# Patient Record
Sex: Female | Born: 1935 | Race: White | Hispanic: No | Marital: Married | Smoking: Never smoker
Health system: Southern US, Community
[De-identification: ages and names within clinical notes are randomized; demographics above are authoritative.]

## PROBLEM LIST (undated history)

## (undated) DIAGNOSIS — I2510 Atherosclerotic heart disease of native coronary artery without angina pectoris: Secondary | ICD-10-CM

## (undated) DIAGNOSIS — I4891 Unspecified atrial fibrillation: Secondary | ICD-10-CM

## (undated) DIAGNOSIS — F419 Anxiety disorder, unspecified: Secondary | ICD-10-CM

## (undated) DIAGNOSIS — I495 Sick sinus syndrome: Secondary | ICD-10-CM

## (undated) DIAGNOSIS — G459 Transient cerebral ischemic attack, unspecified: Secondary | ICD-10-CM

## (undated) DIAGNOSIS — I10 Essential (primary) hypertension: Secondary | ICD-10-CM

## (undated) DIAGNOSIS — M1990 Unspecified osteoarthritis, unspecified site: Secondary | ICD-10-CM

## (undated) DIAGNOSIS — E039 Hypothyroidism, unspecified: Secondary | ICD-10-CM

## (undated) DIAGNOSIS — E785 Hyperlipidemia, unspecified: Secondary | ICD-10-CM

## (undated) HISTORY — PX: TONSILLECTOMY: SUR1361

## (undated) HISTORY — DX: Transient cerebral ischemic attack, unspecified: G45.9

## (undated) HISTORY — PX: CATARACT EXTRACTION W/ INTRAOCULAR LENS IMPLANT: SHX1309

## (undated) HISTORY — DX: Essential (primary) hypertension: I10

## (undated) HISTORY — DX: Hypothyroidism, unspecified: E03.9

## (undated) HISTORY — DX: Unspecified osteoarthritis, unspecified site: M19.90

## (undated) HISTORY — PX: TOTAL ABDOMINAL HYSTERECTOMY W/ BILATERAL SALPINGOOPHORECTOMY: SHX83

## (undated) HISTORY — DX: Atherosclerotic heart disease of native coronary artery without angina pectoris: I25.10

## (undated) HISTORY — DX: Unspecified atrial fibrillation: I48.91

## (undated) HISTORY — DX: Sick sinus syndrome: I49.5

## (undated) HISTORY — DX: Hyperlipidemia, unspecified: E78.5

## (undated) HISTORY — DX: Anxiety disorder, unspecified: F41.9

## (undated) HISTORY — PX: BREAST BIOPSY: SHX20

## (undated) HISTORY — PX: PACEMAKER INSERTION: SHX728

---

## 2004-12-07 ENCOUNTER — Emergency Department (HOSPITAL_COMMUNITY): Admission: EM | Admit: 2004-12-07 | Discharge: 2004-12-07 | Payer: Self-pay | Admitting: Emergency Medicine

## 2004-12-09 ENCOUNTER — Emergency Department (HOSPITAL_COMMUNITY): Admission: EM | Admit: 2004-12-09 | Discharge: 2004-12-09 | Payer: Self-pay | Admitting: Emergency Medicine

## 2006-12-20 ENCOUNTER — Ambulatory Visit: Payer: Self-pay | Admitting: Cardiology

## 2008-01-14 ENCOUNTER — Ambulatory Visit: Payer: Self-pay | Admitting: Cardiology

## 2008-01-16 ENCOUNTER — Encounter: Payer: Self-pay | Admitting: Cardiology

## 2008-01-16 ENCOUNTER — Ambulatory Visit: Payer: Self-pay | Admitting: Cardiovascular Disease

## 2008-01-16 ENCOUNTER — Ambulatory Visit: Payer: Self-pay | Admitting: Internal Medicine

## 2008-01-16 ENCOUNTER — Inpatient Hospital Stay (HOSPITAL_COMMUNITY): Admission: AD | Admit: 2008-01-16 | Discharge: 2008-01-23 | Payer: Self-pay | Admitting: Cardiology

## 2008-01-18 ENCOUNTER — Encounter: Payer: Self-pay | Admitting: Internal Medicine

## 2008-01-19 ENCOUNTER — Encounter: Payer: Self-pay | Admitting: Cardiology

## 2008-02-18 ENCOUNTER — Ambulatory Visit: Payer: Self-pay | Admitting: Cardiology

## 2008-02-18 DIAGNOSIS — I4891 Unspecified atrial fibrillation: Secondary | ICD-10-CM | POA: Insufficient documentation

## 2008-02-18 DIAGNOSIS — I2510 Atherosclerotic heart disease of native coronary artery without angina pectoris: Secondary | ICD-10-CM | POA: Insufficient documentation

## 2008-03-24 ENCOUNTER — Ambulatory Visit: Payer: Self-pay | Admitting: Cardiology

## 2008-04-23 ENCOUNTER — Ambulatory Visit: Payer: Self-pay | Admitting: Internal Medicine

## 2008-07-10 ENCOUNTER — Encounter: Payer: Self-pay | Admitting: Cardiology

## 2008-08-18 ENCOUNTER — Ambulatory Visit: Payer: Self-pay | Admitting: Cardiology

## 2008-09-30 ENCOUNTER — Encounter: Payer: Self-pay | Admitting: Cardiology

## 2008-10-17 ENCOUNTER — Encounter: Payer: Self-pay | Admitting: Cardiology

## 2008-11-21 ENCOUNTER — Encounter (INDEPENDENT_AMBULATORY_CARE_PROVIDER_SITE_OTHER): Payer: Self-pay | Admitting: *Deleted

## 2009-01-08 ENCOUNTER — Encounter: Payer: Self-pay | Admitting: Internal Medicine

## 2009-01-08 ENCOUNTER — Ambulatory Visit: Payer: Self-pay | Admitting: Internal Medicine

## 2009-01-08 DIAGNOSIS — I495 Sick sinus syndrome: Secondary | ICD-10-CM | POA: Insufficient documentation

## 2009-08-23 ENCOUNTER — Encounter: Payer: Self-pay | Admitting: Cardiology

## 2009-08-24 ENCOUNTER — Encounter: Payer: Self-pay | Admitting: Cardiology

## 2009-08-25 ENCOUNTER — Telehealth (INDEPENDENT_AMBULATORY_CARE_PROVIDER_SITE_OTHER): Payer: Self-pay | Admitting: *Deleted

## 2009-08-31 ENCOUNTER — Encounter (INDEPENDENT_AMBULATORY_CARE_PROVIDER_SITE_OTHER): Payer: Self-pay | Admitting: *Deleted

## 2009-08-31 ENCOUNTER — Ambulatory Visit: Payer: Self-pay | Admitting: Physician Assistant

## 2009-08-31 DIAGNOSIS — I10 Essential (primary) hypertension: Secondary | ICD-10-CM | POA: Insufficient documentation

## 2009-08-31 DIAGNOSIS — R079 Chest pain, unspecified: Secondary | ICD-10-CM

## 2009-08-31 DIAGNOSIS — E785 Hyperlipidemia, unspecified: Secondary | ICD-10-CM | POA: Insufficient documentation

## 2009-09-03 ENCOUNTER — Encounter: Payer: Self-pay | Admitting: Physician Assistant

## 2009-09-03 ENCOUNTER — Ambulatory Visit: Payer: Self-pay | Admitting: Cardiology

## 2009-09-10 ENCOUNTER — Ambulatory Visit: Payer: Self-pay | Admitting: Cardiology

## 2009-09-11 ENCOUNTER — Inpatient Hospital Stay (HOSPITAL_COMMUNITY): Admission: EM | Admit: 2009-09-11 | Discharge: 2009-09-18 | Payer: Self-pay | Admitting: Cardiology

## 2009-09-11 ENCOUNTER — Ambulatory Visit: Payer: Self-pay | Admitting: Cardiology

## 2009-09-14 ENCOUNTER — Encounter: Payer: Self-pay | Admitting: Cardiology

## 2009-09-18 ENCOUNTER — Encounter: Payer: Self-pay | Admitting: Cardiology

## 2009-09-19 ENCOUNTER — Ambulatory Visit: Payer: Self-pay | Admitting: Internal Medicine

## 2009-09-19 ENCOUNTER — Inpatient Hospital Stay (HOSPITAL_COMMUNITY): Admission: EM | Admit: 2009-09-19 | Discharge: 2009-10-01 | Payer: Self-pay | Admitting: Emergency Medicine

## 2009-09-19 ENCOUNTER — Encounter: Payer: Self-pay | Admitting: Cardiology

## 2009-09-20 ENCOUNTER — Encounter: Payer: Self-pay | Admitting: Internal Medicine

## 2009-09-21 ENCOUNTER — Encounter: Payer: Self-pay | Admitting: Cardiology

## 2009-09-21 ENCOUNTER — Ambulatory Visit: Payer: Self-pay | Admitting: Cardiothoracic Surgery

## 2009-09-22 ENCOUNTER — Encounter: Payer: Self-pay | Admitting: Cardiothoracic Surgery

## 2009-09-25 HISTORY — PX: CORONARY ARTERY BYPASS GRAFT: SHX141

## 2009-10-01 ENCOUNTER — Inpatient Hospital Stay: Admission: AD | Admit: 2009-10-01 | Discharge: 2009-11-12 | Payer: Self-pay | Admitting: Internal Medicine

## 2009-10-08 ENCOUNTER — Encounter: Payer: Self-pay | Admitting: Physician Assistant

## 2009-10-08 ENCOUNTER — Ambulatory Visit: Payer: Self-pay | Admitting: Cardiology

## 2009-10-12 ENCOUNTER — Telehealth (INDEPENDENT_AMBULATORY_CARE_PROVIDER_SITE_OTHER): Payer: Self-pay | Admitting: *Deleted

## 2009-10-15 ENCOUNTER — Encounter: Admission: RE | Admit: 2009-10-15 | Discharge: 2009-10-15 | Payer: Self-pay | Admitting: Cardiothoracic Surgery

## 2009-10-15 ENCOUNTER — Encounter: Payer: Self-pay | Admitting: Cardiology

## 2009-10-15 ENCOUNTER — Ambulatory Visit: Payer: Self-pay | Admitting: Cardiothoracic Surgery

## 2009-10-16 ENCOUNTER — Telehealth (INDEPENDENT_AMBULATORY_CARE_PROVIDER_SITE_OTHER): Payer: Self-pay | Admitting: *Deleted

## 2009-10-16 ENCOUNTER — Encounter: Payer: Self-pay | Admitting: Cardiology

## 2009-10-16 ENCOUNTER — Telehealth: Payer: Self-pay | Admitting: Cardiology

## 2009-10-16 LAB — CONVERTED CEMR LAB: Prothrombin Time: 19 s

## 2009-10-19 ENCOUNTER — Encounter: Payer: Self-pay | Admitting: Cardiology

## 2009-10-19 ENCOUNTER — Telehealth: Payer: Self-pay | Admitting: Cardiology

## 2009-10-19 LAB — CONVERTED CEMR LAB
INR: 2.61
Prothrombin Time: 27.7 s

## 2009-10-26 ENCOUNTER — Encounter (INDEPENDENT_AMBULATORY_CARE_PROVIDER_SITE_OTHER): Payer: Self-pay | Admitting: *Deleted

## 2009-10-27 ENCOUNTER — Encounter: Payer: Self-pay | Admitting: Cardiology

## 2009-10-27 ENCOUNTER — Telehealth: Payer: Self-pay | Admitting: Cardiology

## 2009-10-27 LAB — CONVERTED CEMR LAB: INR: 6.75

## 2009-10-29 ENCOUNTER — Telehealth: Payer: Self-pay | Admitting: Cardiology

## 2009-10-29 ENCOUNTER — Encounter: Payer: Self-pay | Admitting: Cardiology

## 2009-10-29 LAB — CONVERTED CEMR LAB: Prothrombin Time: 55.1 s

## 2009-11-02 ENCOUNTER — Encounter: Payer: Self-pay | Admitting: Cardiovascular Disease

## 2009-11-02 LAB — CONVERTED CEMR LAB: POC INR: 1.77

## 2009-11-05 ENCOUNTER — Ambulatory Visit: Payer: Self-pay | Admitting: Cardiothoracic Surgery

## 2009-11-05 ENCOUNTER — Encounter: Payer: Self-pay | Admitting: Cardiology

## 2009-11-06 ENCOUNTER — Encounter: Payer: Self-pay | Admitting: Cardiology

## 2009-11-06 ENCOUNTER — Telehealth: Payer: Self-pay | Admitting: Cardiology

## 2009-11-12 ENCOUNTER — Telehealth: Payer: Self-pay | Admitting: Cardiology

## 2009-11-12 ENCOUNTER — Encounter: Payer: Self-pay | Admitting: Cardiology

## 2009-11-12 LAB — CONVERTED CEMR LAB
POC INR: 2.05
Prothrombin Time: 23.3 s

## 2009-11-19 ENCOUNTER — Telehealth (INDEPENDENT_AMBULATORY_CARE_PROVIDER_SITE_OTHER): Payer: Self-pay | Admitting: *Deleted

## 2009-11-23 ENCOUNTER — Telehealth (INDEPENDENT_AMBULATORY_CARE_PROVIDER_SITE_OTHER): Payer: Self-pay | Admitting: *Deleted

## 2009-11-26 ENCOUNTER — Encounter: Payer: Self-pay | Admitting: Cardiology

## 2009-12-07 ENCOUNTER — Encounter: Payer: Self-pay | Admitting: Cardiology

## 2009-12-25 ENCOUNTER — Encounter (INDEPENDENT_AMBULATORY_CARE_PROVIDER_SITE_OTHER): Payer: Self-pay | Admitting: *Deleted

## 2009-12-31 ENCOUNTER — Ambulatory Visit: Payer: Self-pay | Admitting: Cardiology

## 2010-01-01 ENCOUNTER — Encounter: Payer: Self-pay | Admitting: Cardiology

## 2010-01-27 ENCOUNTER — Encounter (INDEPENDENT_AMBULATORY_CARE_PROVIDER_SITE_OTHER): Payer: Self-pay | Admitting: *Deleted

## 2010-02-02 ENCOUNTER — Encounter: Payer: Self-pay | Admitting: Cardiology

## 2010-02-02 ENCOUNTER — Encounter (INDEPENDENT_AMBULATORY_CARE_PROVIDER_SITE_OTHER): Payer: Self-pay | Admitting: *Deleted

## 2010-02-22 ENCOUNTER — Ambulatory Visit: Payer: Self-pay | Admitting: Internal Medicine

## 2010-02-23 ENCOUNTER — Encounter: Payer: Self-pay | Admitting: Internal Medicine

## 2010-03-31 ENCOUNTER — Encounter (INDEPENDENT_AMBULATORY_CARE_PROVIDER_SITE_OTHER): Payer: Self-pay | Admitting: *Deleted

## 2010-04-27 NOTE — Progress Notes (Signed)
Summary: coumadin management  Phone Note Other Incoming   Caller: Miranda @ [facility] Reason for Call: Discuss lab or test results Summary of Call: Called with results of PT/INR obtained on pt today ([facility]).  PT 23.3  INR 2.05  Coumadin has been on hold x 2 days per Dr [name].  Pt saw him on 8/15/11for her thyroid.  INR was 4   Nursing Home was told to hold coumadin and recheck 11/12/09.  Pt is being d/c home today.  Order given for pt to restart coumadin at 2.5mg  once daily except 5mg  on Wednesdays. Initial call taken by: Vashti Hey RN,  November 12, 2009 11:38 AM      Anticoagulation Management History:      Her anticoagulation is being managed by telephone today.  She is being anticoagulated due to atrial fibrillation.  Positive risk factors for bleeding include an age of 45 years or older.  The bleeding index is 'intermediate risk'.  Positive CHADS2 values include History of HTN and Age > 50 years old.  The start date was 10/13/2009.  Her last INR was 2.64.  Prothrombin time is 23.3.  Anticoagulation responsible Cyruss Arata: Rothbart MD,Robert.  INR POC: 2.05.    Anticoagulation Management Assessment/Plan:      The patient's current anticoagulation dose is Warfarin sodium 5 mg tabs: Use as directed by Anticoagulation Clinic.  The target INR is 2.0-3.0.  The next INR is due 11/23/2009.  Anticoagulation instructions were given to [facility].  Results were reviewed/authorized by Vashti Hey RN.  She was notified by [facility].         Prior Anticoagulation Instructions: PT 28.3  INR 2.64 Order given for pt to continue coumadin 2.5mg  once daily except 5mg  on M,W,F and recheck INR on Thurs. 11/12/09.  Current Anticoagulation Instructions: Called with results of PT/INR obtained on pt today ([facility]).  PT 23.3  INR 2.05  Coumadin has been on hold x 2 days per Dr [name].  Pt saw him on 8/15/11for her thyroid.  INR was 4   Nursing Home was told to hold coumadin and recheck  11/12/09.  Pt is being d/c home today.  Order given for pt to restart coumadin at 2.5mg  once daily except 5mg  on Wednesdays.   Anticoagulant Therapy  Managed by: Vashti Hey RN PCP: Dr. Selinda Flavin Supervising MD: Dietrich Pates MD,Robert Indication 1: Atrial Fibrillation Lab Used: [facility] [place] Site: [place] PT 23.3 INR POC 2.05 INR RANGE 2.0-3.0  Dietary changes: no    Health status changes: no    Bleeding/hemorrhagic complications: no    Recent/future hospitalizations: no    Any changes in medication regimen? no    Recent/future dental: no  Any missed doses?: yes     Details: coumadin has been on hold x 2 days  Is patient compliant with meds? yes

## 2010-04-27 NOTE — Progress Notes (Signed)
Summary: coumadin management  Phone Note Other Incoming   Caller: [facility] @ [facility] Reason for Call: Discuss lab or test results Summary of Call: Called with results of PT/INR obtained on pt today.  PT 19.0  INR 1.61  She was started on couamdin 5mg  once daily on 10/13/09.  Order given for pt to continue coumadin 5mg  once daily and recheck INR on 10/19/09.     Anticoagulant Therapy  Managed by: Vashti Hey RN PCP: Dr. Selinda Flavin Supervising MD: Diona Browner MD, Remi Deter Indication 1: Atrial Fibrillation Lab Used: [facility] [place] Site: [place] PT 19.0  Dietary changes: no    Health status changes: no    Bleeding/hemorrhagic complications: no    Recent/future hospitalizations: yes       Details: was in [facility] with unstable angina and atrial fib  Any changes in medication regimen? yes       Details: started on coumadin 5mg  qd on 10/13/09  plavix d/c 10/16/09 s/p bare metal stent  Recent/future dental: no  Any missed doses?: no       Is patient compliant with meds? yes         Anticoagulation Management History:      The patient is taking warfarin and comes in today for a routine follow up visit.  She is being anticoagulated due to atrial fibrillation.  Positive risk factors for bleeding include an age of 47 years or older.  The bleeding index is 'intermediate risk'.  Positive CHADS2 values include History of HTN and Age > 30 years old.  The start date was 10/13/2009.  Today's INR is 1.61.  Prothrombin time is 19.0.  Anticoagulation responsible provider: Diona Browner MD, Remi Deter.  Cuvette Lot#: [id number].    Anticoagulation Management Assessment/Plan:      The patient's current anticoagulation dose is Warfarin sodium 5 mg tabs: Use as directed by Anticoagulation Clinic.  The target INR is 2.0-3.0.  The next INR is due 10/19/2009.  Anticoagulation instructions were given to [facility].  Results were reviewed/authorized by Vashti Hey RN.  She was notified by [facility].         Current Anticoagulation Instructions: Called with results of PT/INR obtained on pt today.  PT 19.0  INR 1.61  She was started on couamdin 5mg  once daily on 10/13/09.  Order given for pt to continue coumadin 5mg  once daily and recheck INR on 10/19/09.

## 2010-04-27 NOTE — Medication Information (Signed)
Summary: Coumadin Clinic  Anticoagulant Therapy  Managed by: Inactive PCP: Dr. Selinda Flavin Supervising MD: Dietrich Pates MD,Robert Indication 1: Atrial Fibrillation Lab Used: [facility] [place] Site: [place] INR RANGE 2.0-3.0          Comments: Dr [name] is managing coumadin per daughter.  Allergies: No Known Drug Allergies  Anticoagulation Management History:      She is being anticoagulated due to atrial fibrillation.  Positive risk factors for bleeding include an age of 11 years or older.  The bleeding index is 'intermediate risk'.  Positive CHADS2 values include History of HTN and Age > 64 years old.  The start date was 10/13/2009.  Her last INR was 2.64.  Anticoagulation responsible provider: Rothbart MD,Robert.    Anticoagulation Management Assessment/Plan:      The patient's current anticoagulation dose is Warfarin sodium 5 mg tabs: Use as directed by Anticoagulation Clinic.  The target INR is 2.0-3.0.  The next INR is due 11/23/2009.  Anticoagulation instructions were given to [facility].  Results were reviewed/authorized by Inactive.         Prior Anticoagulation Instructions: Called with results of PT/INR obtained [facility] ([facility]).  PT 23.3  INR 2.05  Coumadin has been on hold x 2 days per Dr [name].  Pt saw him on 8/15/11for her thyroid.  INR was 4   Nursing Home was told to hold coumadin and recheck 11/12/09.  Pt is being d/c home today.  Order given for pt to restart coumadin at 2.5mg  once daily except 5mg  on Wednesdays.

## 2010-04-27 NOTE — Letter (Signed)
Summary: [facility] Office Visit   [facility] Office Visit   Imported By: Roderic Ovens 12/11/2009 16:17:25  _____________________________________________________________________  External Attachment:    Type:   Image     Comment:   External Document

## 2010-04-27 NOTE — Progress Notes (Signed)
Summary: coumadin management  Phone Note From Other Clinic Call back at [phone]   Caller: TRISHA Request: Talk with Nurse Details for Reason: INR RESULTS Summary of Call: PT 28.3   INR 2.64 Initial call taken by: Claudette Laws,  November 06, 2009 9:39 AM  Follow-up for Phone Call        Order given for pt to continue coumadin 2.5mg  once daily except 5mg  on M,W,F and recheck INR on Thurs. 11/12/09. Follow-up by: Vashti Hey RN,  November 06, 2009 1:13 PM     Anticoagulant Therapy  Managed by: Vashti Hey RN PCP: Dr. Selinda Flavin Supervising MD: Myrtis Ser MD, Tinnie Gens Indication 1: Atrial Fibrillation Lab Used: [facility] [place] Site: [place] PT 28.3 INR RANGE 2.0-3.0  Dietary changes: no    Health status changes: no    Bleeding/hemorrhagic complications: no    Recent/future hospitalizations: no    Any changes in medication regimen? no    Recent/future dental: no  Any missed doses?: no       Is patient compliant with meds? yes         Anticoagulation Management History:      Her anticoagulation is being managed by telephone today.  She is being anticoagulated due to atrial fibrillation.  Positive risk factors for bleeding include an age of 23 years or older.  The bleeding index is 'intermediate risk'.  Positive CHADS2 values include History of HTN and Age > 40 years old.  The start date was 10/13/2009.  Her last INR was 6.28 and today's INR is 2.64.  Prothrombin time is 28.3.  Anticoagulation responsible provider: Myrtis Ser MD, Tinnie Gens.    Anticoagulation Management Assessment/Plan:      The patient's current anticoagulation dose is Warfarin sodium 5 mg tabs: Use as directed by Anticoagulation Clinic.  The target INR is 2.0-3.0.  The next INR is due 11/12/2009.  Anticoagulation instructions were given to [facility].  Results were reviewed/authorized by Vashti Hey RN.  She was notified by Vashti Hey RN.         Prior Anticoagulation Instructions: INR 1.77    Decrease dose to 2.5mg  every day except 5mg  on Monday, Wednesday and Friday.  Recheck INR on Friday.  Spoke with Charity fundraiser at [facility].   Current Anticoagulation Instructions: PT 28.3  INR 2.64 Order given for pt to continue coumadin 2.5mg  once daily except 5mg  on M,W,F and recheck INR on Thurs. 11/12/09.

## 2010-04-27 NOTE — Letter (Signed)
Summary: [facility] Addendum  [facility] Addendum   Imported By: Roderic Ovens 10/12/2009 09:37:19  _____________________________________________________________________  External Attachment:    Type:   Image     Comment:   External Document

## 2010-04-27 NOTE — Assessment & Plan Note (Signed)
Summary: 1 WK F/U PER 6/6 OV W/GENE-JM   Visit Type:  Follow-up Primary Provider:  Dr. Selinda Flavin   History of Present Illness: 77 year old woman presents for followup.  She saw Mr. Shara Blazing last week with somewhat atypical chest pain in the setting of known CAD, that has been addressed medically to this point.  Followup cardiolite results are noted below.  Findings are consistent with ischemia in the mid to apical anteroseptal wall, which would be consistent with her coronary anatomy as documented by catheterization in 2009. At that point she had a focal 90% stenosis in the mid LAD, that was felt to be fairly high risk for intervention.  I reviewed the results with the patient today, and discussed the implications. It is certainly likely that she has manifested progressive CAD over the last 2 years. It is also likely that her options for intervention remain somewhat complex. We discussed the possibilities of either proceeding with a repeat diagnostic cardiac catheterization to reassess coronary anatomy, or try and modify medical therapy for more antianginal benefit. At this point she is most comfortable with the latter.  I discussed her symptoms today, and she does describe what sounds like fairly typical angina at times. She has been using Ativan with some benefit, and I spoke with her about trying nitroglycerin.  Preventive Screening-Counseling & Management  Alcohol-Tobacco     Smoking Status: never  Current Medications (verified): 1)  Diltiazem Hcl 120 Mg Tabs (Diltiazem Hcl) .... Take 1 Tablet By Mouth Two Times A Day 2)  Warfarin Sodium 2.5 Mg Tabs (Warfarin Sodium) .... Use As Directed Per Anticoagulative Clinic 3)  Hydrochlorothiazide 25 Mg Tabs (Hydrochlorothiazide) .... Take One Tablet By Mouth Daily. 4)  Aspirin 81 Mg Tbec (Aspirin) .... Take One Tablet By Mouth Daily 5)  Levothyroxine Sodium 50 Mcg Tabs (Levothyroxine Sodium) .... Take One Tablet By Mouth Once Daily. 6)  Lorazepam  0.5 Mg Tabs (Lorazepam) .... As Needed 7)  Potassium Chloride Cr 10 Meq Cr-Tabs (Potassium Chloride) .... Take 2 Tablet By Mouth Once A Day 8)  Nitrostat 0.4 Mg Subl (Nitroglycerin) .Marland Kitchen.. 1 Tablet Under Tongue At Onset of Chest Pain; You May Repeat Every 5 Minutes For Up To 3 Doses.  Allergies (verified): No Known Drug Allergies  Comments:  Nurse/Medical Assistant: The patient's medication bottles and allergies were reviewed with the patient and were updated in the Medication and Allergy Lists.  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  The patient denies anorexia, fever, syncope, peripheral edema, abdominal pain, melena, and hematochezia.         Otherwise reviewed and negative.  Vital Signs:  Patient profile:   77 year old female Height:      59 inches Weight:      108 pounds Pulse rate:   68 / minute BP sitting:   153 / 65  (left arm) Cuff size:   regular  Vitals Entered By: Carlye Grippe (September 10, 2009 2:02 PM)  Physical Exam  Additional Exam:  GEN:77 year old female, sitting up right,  no distress HEENT: NCAT,PERRLA,EOMI NECK: palpable pulses, no bruits; no JVD; no TM LUNGS: CTA bilaterally HEART: RRR (S1S2); grade 2/6 short systolic ejection murmur at the base; no rubs; no gallops ABD: soft, NT; intact BS EXT: intact distal pulses; trace edema SKIN: warm, dry MUSC: no obvious deformity NEURO: A/O (x3)     Nuclear Study  Procedure date:  09/03/2009  Findings:      Exercise cardiolite with  abnormal ST changes (1 mm depression anterolateral leads), no chest pain, 4.6 METS, hypertensive response.  Mid to apical, medium sized, reversible defect consistent with ischemia. LVEF 58%, TID 1.14.  PPM Specifications Following MD:  Lewayne Bunting, MD     PPM Vendor:  [employer]     PPM Model Number:  [id number]     PPM Serial Number:  [id number] PPM DOI:  01/17/2008     PPM Implanting MD:  Lewayne Bunting, MD  Lead 1    Location: RA     DOI: 01/17/2008     Model #: [id number]     Serial #: [id number]     Status: active Lead 2    Location: RV     DOI: 01/17/2008     Model #: [id number]     Serial #: [id number]     Status: active   Indications:  TACHY-BRADY SYNDROME   PPM Follow Up Pacer Dependent:  No      Parameters Mode:  DDDR     Lower Rate Limit:  60     Upper Rate Limit:  110 Paced AV Delay:  250     Sensed AV Delay:  225  Impression & Recommendations:  Problem # 1:  CAD, NATIVE VESSEL (ICD-414.01)  Known CAD status post cardiac catheterization in 2009 as detailed above. Recent followup exercise Cardiolite is abnormal, and consistent with LAD disease, previously documented. LVEF remains normal. As per the above discussion, we will try to further advance medical therapy, after reviewing the options. Norvasc 5 mg p.o. q.d. will be added. I spoke with her about using her nitroglycerin as well. Plan to see her back over the next month, sooner if needed.  Her updated medication list for this problem includes:    Diltiazem Hcl 120 Mg Tabs (Diltiazem hcl) .Marland Kitchen... Take 1 tablet by mouth two times a day    Warfarin Sodium 2.5 Mg Tabs (Warfarin sodium) ..... Use as directed per anticoagulative clinic    Aspirin 81 Mg Tbec (Aspirin) .Marland Kitchen... Take one tablet by mouth daily    Nitrostat 0.4 Mg Subl (Nitroglycerin) .Marland Kitchen... 1 tablet under tongue at onset of chest pain; you may repeat every 5 minutes for up to 3 doses.    Amlodipine Besylate 5 Mg Tabs (Amlodipine besylate) .Marland Kitchen... Take one tablet by mouth daily  Problem # 2:   ATRIAL FIBRILLATION (ICD-427.31)  Stable at this point on medical therapy.  She continues on Coumadin.  Her updated medication list for this problem includes:    Warfarin Sodium 2.5 Mg Tabs (Warfarin sodium) ..... Use as directed per anticoagulative clinic    Aspirin 81 Mg Tbec (Aspirin) .Marland Kitchen... Take one tablet by mouth daily  Clinical Review Panels:  Cardiac Imaging Cardiac Cath Findings Left mainstem:  Widely patent.  Bifurcates into LAD and left circumflex.      LAD:  The LAD is extremely tortuous.  It has the typical appearance of   the patient with severe hypertensive heart disease with marked vessel   tortuosity.  Proximally, there is a long 50%  stenosis that ends at the   first septal perforator.  The midportion of the vessel just beyond a   large second diagonal branch has a focal 90% stenosis.  This originates   just at the diagonal branch, which supplies two large branch vessels.   It also occurs in an area of extreme tortuosity.  The remaining portion   of the vessel have no significant obstructive disease, but do exhibit   diffuse plaque.      Left circumflex:  The left circumflex is widely patent.  It is a   dominant vessel.  There are two proximal OM branches, both of which are   moderate to large in size.  The AV groove circumflex courses down and   supplies a left PDA and left posterolateral branch.  There is minor   nonobstructive plaque in the second OM branch.  Otherwise, left   circumflex has no significant angiographic stenosis.      Right coronary artery:  The RCA is nondominant.  There is moderate   diffuse stenosis throughout the mid vessel with a long segment of 70%-   80% disease.  The vessel supplies a large acute marginal branch, but   does not appear to supply branches to the inferior wall. (01/16/2008)    Patient Instructions: 1)  Start Norvasc (Amlodipine) 5mg  by mouth once daily. 2)  Follow up appt with Dr. Diona Browner on Thursday, October 08, 2009 at  2pm. Prescriptions: AMLODIPINE BESYLATE 5 MG TABS (AMLODIPINE BESYLATE) Take one tablet by mouth daily  #30 x 6   Entered by:   Cyril Loosen, RN, BSN   Authorized by:   Loreli Slot, MD, [facility]   Signed by:   Cyril Loosen, RN, BSN on 09/10/2009   Method used:   Electronically to        [facility] [address] (retail)       [address]       [place], [place]  [address]       Ph: [phone] or [phone]       Fax: [phone]   RxID:   [id number]   Handout requested.

## 2010-04-27 NOTE — Progress Notes (Signed)
Summary: coumadin management  Phone Note Other Incoming   Caller: Billie @ [facility] Reason for Call: Discuss lab or test results Summary of Call: Called with resuls of PT/INR obtained on pt today.  Coumadin has been on hold since 8/2 for INR of 6.75.   Today PT 55.1  INR 6.28   RN at [facility] denies pt being on any new meds.  Requested med sheet be faxed to me for review. After review Med sheet indicates that coumadin was held on 8/2 & 8/3.  Pt is on Levothyroxine, Diltiazem and Protonix.  Order given for pt to hold coumadin thru 8/7 and repeat INR on 11/02/09.  Staff to monitor closely for s/s of bleeding. Initial call taken by: Vashti Hey RN,  October 29, 2009 10:09 AM     Anticoagulant Therapy  Managed by: Vashti Hey RN PCP: Dr. Selinda Flavin Supervising MD: Diona Browner MD, Remi Deter Indication 1: Atrial Fibrillation Lab Used: [facility] [place] Site: [place] PT 55.1  Dietary changes: no    Health status changes: no    Bleeding/hemorrhagic complications: no    Recent/future hospitalizations: no    Any changes in medication regimen? no    Recent/future dental: no  Any missed doses?: no       Is patient compliant with meds? yes         Anticoagulation Management History:      Her anticoagulation is being managed by telephone today.  She is being anticoagulated due to atrial fibrillation.  Positive risk factors for bleeding include an age of 90 years or older.  The bleeding index is 'intermediate risk'.  Positive CHADS2 values include History of HTN and Age > 88 years old.  The start date was 10/13/2009.  Her last INR was 6.75 and today's INR is 6.28.  Prothrombin time is 55.1.  Anticoagulation responsible Tasheena Wambolt: Diona Browner MD, Remi Deter.    Anticoagulation Management Assessment/Plan:      The patient's current anticoagulation dose is Warfarin sodium 5 mg tabs: Use as directed by Anticoagulation Clinic.  The target INR is 2.0-3.0.  The next INR is due 11/02/2009.   Anticoagulation instructions were given to [facility].  Results were reviewed/authorized by Vashti Hey RN.  She was notified by [facility].         Prior Anticoagulation Instructions: Called with results of PT/INR obtained on pt today.  PT 58.2  INR 6.75  No new meds or changes per nurse.  Hold coumadn x 3 nights and repeat INR on 10/29/09.  Current Anticoagulation Instructions: Called with resuls of PT/INR obtained on pt today.  Coumadin has been on hold since 8/2 for INR of 6.75.   Today PT 55.1  INR 6.28   RN at [facility] denies pt being on any new meds.  Requested med sheet be faxed to me for review. After review Med sheet indicates that coumadin was held on 8/2 & 8/3.  Pt is on Levothyroxine, Diltiazem and Protonix.  Order given for pt to hold coumadin thru 8/7 and repeat INR on 11/02/09.  Staff to monitor closely for s/s of bleeding.

## 2010-04-27 NOTE — Progress Notes (Signed)
Summary: PHONE: CALL FROM DR. HOWARD  Phone Note From Other Clinic   Caller: VALERIE WITH DR. Penni Bombard OFFICE Request: Talk with Provider Details for Reason: 2 ER VISITS WITHIN 24 HOURS Summary of Call: Dr. Penni Bombard office called Vikki Ports) stating that Dr. [name] would like for Dr. Diona Browner to review Latoya Crane's last 2 ER visits from 08/25/2009 and see if Dr. Diona Browner thinks she needs to be followed here in the office ASAP or is this something that is OK to wait on. Initial call taken by: Zachary George,  Aug 25, 2009 2:47 PM  Follow-up for Phone Call        Does not appear that I have seen her in office since 5/10.  I reviewed the recent hospital/ER records - all troponin levels normal.  Looks like Dr. [name] felt symptoms were GI in etiology.  We can certainly see her back in the office to evaluate if he would like.  Please try to find a spot on schedule this week - either Gene or me. Follow-up by: Loreli Slot, MD, [facility],  Aug 25, 2009 3:15 PM  Additional Follow-up for Phone Call Additional follow up Details #1::        Attempted to reach [facility] but line busy. Cyril Loosen, RN, BSN  Aug 25, 2009 4:50 PM  Vikki Ports, nurse at [facility], notified. Appt with Gene  scheduled for Friday, June 3rd  Additional Follow-up by: Cyril Loosen, RN, BSN,  August 26, 2009 12:12 PM

## 2010-04-27 NOTE — Letter (Signed)
Summary: Generic Engineer, agricultural at [facility] [address]   [place], [place] [address]   Phone: [phone]  Fax: [phone]        February 02, 2010 MRN: [id number]    [facility] [address] [place], [place]  [address]    Dear Ms. Gale Journey,   You were asked to have lab work done following  your October 6th office visit. However, it does not appear this has been done yet.  Please, take the enclosed order to the [facility] at your earliest convenience to have this done. Do not eat or drink after midnight.   If you will not be able to do your lab work at this time, please notify our office so that we can properly document this in  your chart.        Sincerely,  Cyril Loosen, RN, BSN  This letter has been electronically signed by your physician.  Appended Document: Generic Letter Received labs from primary MD prior to this letter being mailed.

## 2010-04-27 NOTE — Assessment & Plan Note (Signed)
Summary: EPH D/C CONE CATH EPH   Visit Type:  Follow-up Primary Provider:  Dr. Selinda Flavin   History of Present Illness: 77 year old woman presents for followup. Recent extensive interval history is noted. She was seen in the office back in June, followed quickly by an inpatient consultation at [facility]. Despite medical therapy, she manifested symptoms of unstable angina, and was referred to [facility] for diagnostic cardiac catheterization. Procedure performed on June 22 revealed progressive multivessel disease and the patient underwent placement of a bare-metal stent in the proximal LAD at that time, with significant residual disease not amenable to PCI. She was discharged, and represented to [facility] on the following day with recurrent angina. She was seen in consultation by Dr. Tyrone Sage, and underwent off pump bypass grafting with a LIMA to LAD and SVG to RCA on 1 July.  Discharge summary indicates placement on amiodarone for management of atrial fibrillation. Coumadin was discontinued with plans to treat with dual antiplatelet therapy for at least a month in light of her bare metal stent, and then resume Coumadin. Discharge was to a skilled nursing facility.  Recent labs from July 7 showed hemoglobin 10.1, potassium 3.8, BUN 13, creatinine 1.0.  Patient is currently a resident at [facility], and is making slow, but steady, progress. She denies any recurrent exertional chest tightness, which preceded her recent interventions. She denies tachycardia palpitations. Her essential complaint is that of generalized weakness.  Preventive Screening-Counseling & Management  Alcohol-Tobacco     Smoking Status: never  Current Medications (verified): 1)  Diltiazem Hcl 120 Mg Tabs (Diltiazem Hcl) .... Take 1 Tablet By Mouth Daily 2)  Warfarin Sodium 2.5 Mg Tabs (Warfarin Sodium) .... Use As Directed Per Anticoagulative Clinic(On Hold) 3)   Hydrochlorothiazide 25 Mg Tabs (Hydrochlorothiazide) .... Take One Tablet By Mouth Daily. 4)  Aspir-Trin 325 Mg Tbec (Aspirin) .... Take 1 Tablet By Mouth Once A Day 5)  Levothyroxine Sodium 50 Mcg Tabs (Levothyroxine Sodium) .... Take One Tablet By Mouth Once Daily. 6)  Lorazepam 0.5 Mg Tabs (Lorazepam) .... Take 1 Tablet By Mouth Three Times A Day As Needed 7)  Potassium Chloride Cr 10 Meq Cr-Tabs (Potassium Chloride) .... Take 2 Tablet By Mouth Once A Day 8)  Nitrostat 0.4 Mg Subl (Nitroglycerin) .Marland Kitchen.. 1 Tablet Under Tongue At Onset of Chest Pain; You May Repeat Every 5 Minutes For Up To 3 Doses. 9)  Crestor 10 Mg Tabs (Rosuvastatin Calcium) .... Take 1 Tablet By Mouth Once A Day 10)  Folic Acid 1 Mg Tabs (Folic Acid) .... Take 1 Tablet By Mouth Once A Day 11)  Metoprolol Tartrate 25 Mg Tabs (Metoprolol Tartrate) .... Take 1/2 Tablet By Mouth Two Times A Day 12)  Nu-Iron 150 Mg Caps (Polysaccharide Iron Complex) .... Take 1 Tablet By Mouth Once A Day 13)  Protonix 40 Mg Tbec (Pantoprazole Sodium) .... Take 1 Tablet By Mouth Once A Day 14)  Plavix 75 Mg Tabs (Clopidogrel Bisulfate) .... Take 2 Tablet By Mouth Once A Day Until July 22nd Then D/c 15)  Ultram 50 Mg Tabs (Tramadol Hcl) .... Take 1 Tablet By Mouth Three Times A Day As Needed 16)  Ensure  Liqd (Nutritional Supplements) .... One Can Three Times A Day 17)  Miralax  Powd (Polyethylene Glycol 3350) .Marland Kitchen.. 17gm Mixed With 8oz Water By Mouth Daily 18)  Dulcolax 10 Mg Supp (Bisacodyl) .... One Supp Pr Daily As Needed  Allergies (verified): No Known Drug Allergies  Comments:  Nurse/Medical Assistant: The patient's medication list and allergies were reviewed with the patient and were updated in the Medication and Allergy Lists.  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 Tyrone Sage, LIMA to LAD, SVG to RCA  Review of Systems       No fevers, chills, hemoptysis, dysphagia, melena, hematocheezia, hematuria, rash, claudication, orthopnea, pnd, pedal edema. Denies falls or presyncope/syncope. All other systems negative.   Vital Signs:  Patient profile:   77 year old female Height:      59 inches Weight:      109 pounds BMI:     22.09 Pulse rate:   74 / minute BP sitting:   109 / 69  (left arm) Cuff size:   regular  Vitals Entered By: Carlye Grippe (October 08, 2009 2:05 PM)  Physical Exam  Additional Exam:  GEN: atrial female, sitting upright, in no distress HEENT: NCAT,PERRLA,EOMI NECK: palpable pulses, no bruits; no JVD; no TM LUNGS: CTA bilaterally HEART: RRR (S1S2); soft, grade 2/6 systolic ejection murmur; no rubs; no gallops ABD: soft, NT; intact BS EXT: right groin stable, with no hematoma, ecchymosis, or bruit; small residual suture; statesintact distal pulses; no edema SKIN: palid MUSC: no obvious deformity NEURO: A/O (x3)     Echocardiogram  Procedure date:  09/18/2009  Findings:       Study Conclusions    - Left ventricle: The cavity size was normal. Wall thickness was     increased in a pattern of moderate LVH. Systolic function was     vigorous. The estimated ejection fraction was in the range of 65%     to 70%. There was an LV mid-cavity gradient with peak of about 50     mmHg. Wall motion was normal; there were no regional wall motion     abnormalities. Doppler parameters are consistent with abnormal     left ventricular relaxation (grade 1 diastolic dysfunction).   - Aortic valve: There was no  stenosis.   - Mitral valve: Mild regurgitation.   - Left atrium: The atrium was mildly dilated.   - Right ventricle: The cavity size was normal. Pacer wire or     catheter noted in right ventricle. Systolic function was normal.   - Pulmonary arteries: PA peak pressure: 27mm Hg (S).   - Inferior vena cava: The vessel was normal in size; the     respirophasic diameter changes were in the normal range (= 50%);     findings are consistent with normal central venous pressure.   Impressions:    - Normal LV size with moderate LV hypertrophy. Vigorous systolic     function with small end systolic chamber leading to an LV     mid-cavity gradient that reaches as high as 50 mmHg peak. No wall     motion abnormality but the apex was not well-visualized. The RV     appeared normal in size and systolic function. Would suggest beta     blocker for LV mid-cavity gradient.  Cardiac Cath  Procedure date:  09/16/2009  Findings:       HEMODYNAMIC DATA:  Central aortic  pressure was 99/44, mean 67.      ANGIOGRAPHIC DATA:   1. The left main coronary artery is fairly short.  There is no       critical disease.   2. The left anterior descending artery courses to the apex.  Compared       to 2 years earlier, there is now an ulcerated plaque just prior to       the septal perforator.  There was mild disease at this point on the       previous study.  Just after this, there is a calcified segment of       steep bend, followed by a bifurcation area of stenosis at the       takeoff of a bifurcating diagonal, and the LAD proper.  The LAD       demonstrates TIMI II flow.   3. The circumflex is a moderately large vessel.  It trifurcates into a       first marginal, which bifurcates.  The superior branch has about       60% narrowing of the ostium, but it does not appear to be flow-       limiting.  There is probably 40-50% narrowing in the inferior limb       of the OM and perhaps 40-50% narrowing in the AV  branch.  The AV       branch courses posteriorly where it supplies the significant       portion of the inferior wall.   4. The right coronary artery has about 30% ostial disease.  This is       followed by a steep shepherd's crook, then 80-90% area of segmental       diffuse disease in the proximal portion of the midvessel.  The       distal vessel terminates as a fairly small-caliber distal right       coronary artery.  Notably, there are collaterals to the distal LAD       territory.  Cardiac Cath  Procedure date:  10/08/2009  Findings:      normal sinus rhythm at 81 bpm normal axis; LVH with strain pattern  PPM Specifications Following MD:  Lewayne Bunting, MD     PPM Vendor:  [employer]     PPM Model Number:  [id number]     PPM Serial Number:  [id number] PPM DOI:  01/17/2008     PPM Implanting MD:  Lewayne Bunting, MD  Lead 1    Location: RA     DOI: 01/17/2008     Model #: [id number]     Serial #: [id number]     Status: active Lead 2    Location: RV     DOI: 01/17/2008     Model #: [id number]     Serial #: [id number]     Status: active   Indications:  TACHY-BRADY SYNDROME   PPM Follow Up Pacer Dependent:  No      Parameters Mode:  DDDR     Lower Rate Limit:  60     Upper Rate Limit:  110 Paced AV Delay:  250     Sensed AV Delay:  225  Impression & Recommendations:  Problem # 1:  CAD, NATIVE VESSEL (ICD-414.01)  Patient is progressing slowly, but surely, following recent 2 vessel CABG on July 1, preceded by bare-metal stenting of the proximal LAD on June 22. She has not had any recurrent anterior chest tightness,  which were her presenting symptoms. She is tolerating her current medication regimen, which includes high dose Plavix and aspirin. She is scheduled to stop Plavix on July 22, at which time she is to resume Coumadin. We plan to initiate Coumadin at 5 mg daily, 3 days prior to her stopping Plavix, and then refer her to our Coumadin clinic for subsequent monitoring and management. She was briefly  treated with amiodarone while at [facility], for postop PAF, but was not discharged on this. She remains in normal sinus rhythm, and is on diltiazem and metoprolol for rate control.  Problem # 2:  ATRIAL FIBRILLATION (ICD-427.31)  Maintaining NSR on diltiazem and metoprolol. Plan is to resume Coumadin, as outlined above, following completion of one-month course of high dose Plavix, in conjunction with aspirin. Patient is to remain on full dose aspirin for the time being, which we can then down titrate to 81 mg daily indefinitely, in conjunction with Coumadin, at time of her next office visit.  Problem # 3:  DYSLIPIDEMIA (ICD-272.4)  Aggressive management recommended, with target LDL of 70 or less, if feasible.  Problem # 4:  CARDIAC PACEMAKER IN SITU (ICD-V45.01) Assessment: Comment Only  Problem # 5:  HYPERTENSION, BENIGN (ICD-401.1)  Well controlled on current medication regimen.  Patient Instructions: 1)  Your physician wants you to follow-up in: 3 months. You will receive a reminder letter in the mail one-two months in advance. If you don't receive a letter, please call our office to schedule the follow-up appointment. Call us with updated address when released from [facility]. 2)  Stop Plavix on October 16, 2009 as planned. 3)  Start Coumadin (warfarin) 5mg  by mouth every evening on October 13, 2009. 4)  Have PT/INR drawn on October 16, 2009 and call to Vashti Hey at [phone] for dosing.  5)  A prescription for Coumadin was sent to [facility] and placed on file so that you will have this when discharged from the [facility]. Prescriptions: WARFARIN SODIUM 5 MG TABS (WARFARIN SODIUM) Use as directed by Anticoagulation Clinic  #30 x 0   Entered by:   Cyril Loosen, RN, BSN   Authorized by:   Nelida Meuse, PA-C   Signed by:   Cyril Loosen, RN, BSN on 10/08/2009   Method used:   Electronically to        [facility] [address] (retail)       [address]       [place], [place]  [address]       Ph: [phone] or [phone]       Fax: [phone]   RxID:   [id number]

## 2010-04-27 NOTE — Progress Notes (Signed)
Summary: coumadin management  Phone Note Other Incoming   Caller: Billie @ [facility] Reason for Call: Discuss lab or test results Summary of Call: Called with results of PT/INR obtained on pt today.  PT 58.2  INR 6.75  No new meds or changes per nurse.  Hold coumadn x 3 nights and repeat INR on 10/29/09. Initial call taken by: Vashti Hey RN,  October 27, 2009 8:16 AM     Anticoagulant Therapy  Managed by: Vashti Hey RN PCP: Dr. Selinda Flavin Supervising MD: Andee Lineman MD, Michelle Piper Indication 1: Atrial Fibrillation Lab Used: [facility] Site: [place] PT 58.2  Dietary changes: no    Health status changes: no    Bleeding/hemorrhagic complications: no    Recent/future hospitalizations: no    Any changes in medication regimen? no    Recent/future dental: no  Any missed doses?: no       Is patient compliant with meds? yes         Anticoagulation Management History:      Her anticoagulation is being managed by telephone today.  She is being anticoagulated due to atrial fibrillation.  Positive risk factors for bleeding include an age of 39 years or older.  The bleeding index is 'intermediate risk'.  Positive CHADS2 values include History of HTN and Age > 48 years old.  The start date was 10/13/2009.  Her last INR was 2.61 and today's INR is 6.75.  Prothrombin time is 58.2.  Anticoagulation responsible provider: Andee Lineman MD, Michelle Piper.  Cuvette Lot#: [id number].    Anticoagulation Management Assessment/Plan:      The patient's current anticoagulation dose is Warfarin sodium 5 mg tabs: Use as directed by Anticoagulation Clinic.  The target INR is 2.0-3.0.  The next INR is due 10/29/2009.  Anticoagulation instructions were given to [facility].  Results were reviewed/authorized by Vashti Hey RN.  She was notified by Vashti Hey RN.         Prior Anticoagulation Instructions: Called with results of PT/INR obtained on pt today.  PT 27.7  INR 2.61  Order given for pt to continue coumadin 5mg   once daily and recheck INR 10/26/09 with results to our office.  Current Anticoagulation Instructions: Called with results of PT/INR obtained on pt today.  PT 58.2  INR 6.75  No new meds or changes per nurse.  Hold coumadn x 3 nights and repeat INR on 10/29/09.

## 2010-04-27 NOTE — Letter (Signed)
Summary: Device-Delinquent Check  [facility], Main Office  [address]   [place], [place] [address]   Phone: [phone]  Fax: [phone]     December 25, 2009 MRN: [id number]   [facility] [address] [place], [place]  [address]   Dear Ms. [facility],  According to our records, you have not had your implanted device checked in the recommended period of time.  We are unable to determine appropriate device function without checking your device on a regular basis.  Please call our office to schedule an appointment with Dr Graciela Husbands ,  as soon as possible.  If you are having your device checked by another physician, please call us so that we may update our records.  Thank you, Letta Moynahan, EMT  December 25, 2009 9:46 AM   [facility]

## 2010-04-27 NOTE — Progress Notes (Signed)
Summary: Dgt has questions about d/c from [facility]  Phone Note Call from Patient Call back at [phone]   Summary of Call: Pt saw Dr. Tyrone Sage yesterday (10/16/09). Pt's dgt, Nettie Elm, called the office regarding pt. She states pt won't walk or eat. She states Dr. Tyrone Sage told her that she could come home today if Dr. Diona Browner arranged this. She states pt acts like she's fine with the doctor but then acts differently with the family. Spoke with pt's dgt at length regarding medical management and discharge planning for the pt. Pt's dgt states she is very confused about who is suppose to be handling what and concerned that her mother should not go home b/c she won't walk or eat. Discussed with pt's dgt that a hospitalist or internal/family medicine MD should be responsible for her mother's care while at the [facility]. Notifed pt's dgt that d/c should be arranged by this person and most likely a Child psychotherapist or d/c planner at the [facility]. Also discussed with pt's dgt that pt should have CCR management set up with our office at discharge. Pt's dgt verbalized understanding and states she will d/w nurse at the [facility]. Initial call taken by: Cyril Loosen, RN, BSN,  October 16, 2009 12:28 PM

## 2010-04-27 NOTE — Letter (Signed)
Summary: Generic Engineer, agricultural at [facility] [address]   [place], [place] [address]   Phone: [phone]  Fax: [phone]        January 27, 2010 MRN: [id number]    [facility] [address] [place], [place]  [address]    Dear Ms. Gale Journey,   You were asked to have lab work done following your October 6th office visit. However, it does not appear this has been done yet.  Please, take the enclosed order to the [facility] at your earliest convenience to have this done. Do not eat or drink after midnight.   If you will not be able to do your lab work at this time, please notify our office so that we can properly document this in  your chart.         Sincerely,  Cyril Loosen, RN, BSN  This letter has been electronically signed by your physician.

## 2010-04-27 NOTE — Letter (Signed)
Summary: Device-Delinquent Check  [facility], Main Office  [address]   [place], [place] [address]   Phone: [phone]  Fax: [phone]     October 26, 2009 MRN: [id number]   [facility] Crane [address] [place], [place]  [address]   Dear Ms. [facility],  According to our records, you have not had your implanted device checked in the recommended period of time.  We are unable to determine appropriate device function without checking your device on a regular basis.  Please call our office to schedule an appointment as soon as possible.  If you are having your device checked by another physician, please call us so that we may update our records.  Thank you,   Architectural technologist Device Clinic

## 2010-04-27 NOTE — Letter (Signed)
Summary: Pharmacist, community at [facility]. [address]   [place], [place] [address]   Phone: [phone]  Fax: [phone]      [facility] Cardiovascular Services  Cardiolite Stress Test     [facility]  Your doctor has ordered a Cardiolite Stress Test to help determine the condition of your heart during stress. If you take blood pressure medicine, ask your doctor if you should take it the day of your test. You should not have anything to eat or drink at least 4 hours before your test is scheduled, and no caffeine (coffee, tea, decaf. or chocolate) for 24 hours before your test.   You will need to register at the Outpatient/Main Entrance at the hospital 30 minutes before your appointment time. It is a good idea to bring a copy of your order with you. They will direct you to the Diagnostic Imaging (Radiology) Department.  You will be asked to undress from the waist up and be given a hospital gown to wear, so dress comfortably from the waist down, for example:    Sweat pants, shorts or skirt   Rubber-soled lace up shoes (i.e. tennis shoes)  Plan on about three hours from registration to release from the hospital.    Date of Test:              Time of Test               Hold Diltiazem, potassium, and HCTZ the morning of your test. You make take these medications after your test is complete.

## 2010-04-27 NOTE — Progress Notes (Signed)
Summary: coumadin question  Phone Note Call from Patient Call back at Home Phone [phone] Call back at [phone] cell   Caller: Daughter Nettie Elm Reason for Call: Talk to Nurse Details for Reason: CCR Summary of Call: Daughter called to cxl CCR schedule in [place].  Said that [employer] her CCR.    Does she need to stay on coumadin.  Can she not just take aspirin and Plavix.  Daughter really concerned with her health.  Did say she is pron to mini stroke  Please call her in the afternoon. Initial call taken by: Claudette Laws,  November 19, 2009 3:50 PM  Follow-up for Phone Call        Talked with Nettie Elm (pt's daughter).  Explained to her why pt was taken off plavix and restarted on coumadin.  She states Dr [name] is managing pt's coumadin since d/c from nursing home and he wondered if it was necessary for pt to be on coumadin.  Explained to her pt's CHADS score 4.  States Dr [name] is going to check her INR weekly.  Daughter agrees to continue with coumadin untill office visit with Dr Diona Browner 12/31/09.  At that time they can discuss options of coumadin vs plavix vs pradaxa.  Literature mailed to daughter re. Vit K foods and coumadin management since she is confused about foods with Vit K. Follow-up by: Vashti Hey RN,  November 19, 2009 4:35 PM  Additional Follow-up for Phone Call Additional follow up Details #1::        FYI    Additional Follow-up for Phone Call Additional follow up Details #2::    Noted. Follow-up by: Loreli Slot, MD, [facility],  November 20, 2009 8:16 AM

## 2010-04-27 NOTE — Assessment & Plan Note (Signed)
Summary: F1Y   Visit Type:  Follow-up Primary Donta Fuster:  Dr. Selinda Flavin  CC:  no cardiology complaints.  History of Present Illness: Latoya Crane returns today for followup.  She has a h/o symptomatic tachy-brady, s/p PPM, HTN, and PAF.  She denies c/p, sob, or peripheral edema.  She notes that her blood pressure has been well controlled when she goes to the doctor's office to have her coumadin checked.  In the interim, she has undergone CABG.  She denies c/p, sob, or peripheral edema.  Current Medications (verified): 1)  Diltiazem Hcl 120 Mg Tabs (Diltiazem Hcl) .... Take 1 Tablet By Mouth Daily 2)  Warfarin Sodium 2 Mg Tabs (Warfarin Sodium) .... Use As Directed Per [name] 3)  Hydrochlorothiazide 25 Mg Tabs (Hydrochlorothiazide) .... Take One Tablet By Mouth Daily. 4)  Aspir-Low 81 Mg Tbec (Aspirin) .... Take 1 Tablet By Mouth Once A Day 5)  Levothyroxine Sodium 50 Mcg Tabs (Levothyroxine Sodium) .... Take One Tablet By Mouth Once Daily. 6)  Lorazepam 0.5 Mg Tabs (Lorazepam) .... Take 1 Tablet By Mouth Three Times A Day As Needed 7)  Potassium Chloride Cr 10 Meq Cr-Tabs (Potassium Chloride) .... Take 2 Tablet By Mouth Once A Day 8)  Nitrostat 0.4 Mg Subl (Nitroglycerin) .Marland Kitchen.. 1 Tablet Under Tongue At Onset of Chest Pain; You May Repeat Every 5 Minutes For Up To 3 Doses. 9)  Crestor 10 Mg Tabs (Rosuvastatin Calcium) .... Take 1 Tablet By Mouth Once A Day 10)  Metoprolol Tartrate 25 Mg Tabs (Metoprolol Tartrate) .... Take 1/2 Tablet By Mouth Two Times A Day 11)  Protonix 40 Mg Tbec (Pantoprazole Sodium) .... Take 1 Tablet By Mouth Once A Day 12)  Ensure  Liqd (Nutritional Supplements) .... One Can Three Times A Day 13)  Miralax  Powd (Polyethylene Glycol 3350) .Marland Kitchen.. 17gm Mixed With 8oz Water By Mouth Daily 14)  Dulcolax 10 Mg Supp (Bisacodyl) .... One Supp Pr Daily As Needed 15)  Mirtazapine 15 Mg Tabs (Mirtazapine) .... Take 1/2 Tablet By Mouth Once A Day At Bedtime 16)  Fish  Oil Double Strength 1200 Mg Caps (Omega-3 Fatty Acids) .... 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 Tyrone Sage, LIMA to LAD, SVG to RCA  Review of Systems  The patient denies chest pain, syncope, dyspnea on exertion, and peripheral edema.    Vital Signs:  Patient profile:   77 year old female Weight:      111 pounds BMI:     22.50 Pulse rate:   71 / minute BP sitting:   166 / 77  (right arm)  Vitals Entered By: Dreama Saa, CNA (February 22, 2010 4:17 PM)  Physical Exam  General:  Elderly woman NAD. Head:  [place]/AT Eyes:  PERRLA/EOM intact; conjunctiva and lids normal. Neck:  No JVD Chest Wall:  Well healed PPM incision. Lungs:  Minimal basilar rales with no wheezes, or rhonchi. Heart:  RRR with normal S1 and S2. No murmurs. Abdomen:  Bowel sounds positive; abdomen soft  and non-tender without masses, organomegaly, or hernias noted. No hepatosplenomegaly. Msk:  Back normal, normal gait. Muscle strength and tone normal. Pulses:  pulses normal in all 4 extremities Extremities:  No clubbing or cyanosis. Trace peripheral edema. Neurologic:  Alert and oriented x 3.   PPM Specifications Following MD:  Lewayne Bunting, MD     PPM Vendor:  [employer] Jude     PPM Model Number:  [id number]     PPM Serial Number:  [id number] DOI:  01/17/2008     PPM Implanting MD:  Lewayne Bunting, MD  Lead 1    Location: [facility]      DOI: 01/17/2008     Model #: [id number]     Serial #: [id number]     Status: active Lead 2    Location: [facility]     DOI: 01/17/2008     Model #: [id number]     Serial #: [id number]     Status: active   Indications:  TACHY-BRADY SYNDROME   PPM Follow Up Remote Check?  No Battery Voltage:  2.78 V     Battery Est. Longevity:  4.5 years     Pacer Dependent:  No       PPM Device Measurements Atrium  Amplitude: 4.1 mV, Impedance: 408 ohms, Threshold: 0.5 V at 0.5 msec Right Ventricle  Amplitude: 12 mV, Impedance: 328 ohms, Threshold: 0.5 V at 0.5 msec  Episodes MS Episodes:  48     Percent Mode Switch:  <1%     Coumadin:  Yes Ventricular High Rate:  <1%      Parameters Mode:  DDDR     Lower Rate Limit:  60     Upper Rate Limit:  110 Paced AV Delay:  250     Sensed AV Delay:  225 Next Cardiology Appt Due:  07/27/2010 Tech Comments:  Auto capture programmed on in both chambers.  Device function normal.  ROV 6 months [facility] clinic. Altha Harm, LPN  February 22, 2010 4:31 PM  MD Comments:  Agree with above.  Impression & Recommendations:  Problem # 1:  CARDIAC PACEMAKER IN SITU (ICD-V45.01) Her device is working normally.  will recheck in several months.  Problem # 2:  HYPERTENSION, BENIGN (ICD-401.1) Her blood pressure is well controlled.  Will continue meds as below and maintain a low sodium diet. Her updated medication list for this problem includes:    Diltiazem Hcl 120 Mg Tabs (Diltiazem hcl) .Marland Kitchen... Take 1 tablet by mouth daily    Hydrochlorothiazide 25 Mg Tabs (Hydrochlorothiazide) .Marland Kitchen... Take one tablet by mouth daily.    Aspir-low 81 Mg Tbec (Aspirin) .Marland Kitchen... Take 1 tablet by mouth once a day    Metoprolol Tartrate 25 Mg Tabs (Metoprolol tartrate) .Marland Kitchen... Take 1/2 tablet by mouth two times a day  Problem # 3:  ATRIAL FIBRILLATION (ICD-427.31) She has had minimal symptoms and will maintain her meds as below. Her updated medication list for this problem includes:    Warfarin Sodium 2 Mg Tabs  (Warfarin sodium) ..... Use as directed per [facility]    Aspir-low 81 Mg Tbec (Aspirin) .Marland Kitchen... Take 1 tablet by mouth once a day    Metoprolol Tartrate 25 Mg Tabs (Metoprolol tartrate) .Marland Kitchen... Take 1/2 tablet by mouth two times a day  Patient Instructions: 1)  Your physician recommends that you schedule a follow-up appointment in: 6 months with Gunnar Fusi for device check and in 1 year with Dr. [name]

## 2010-04-27 NOTE — Consult Note (Signed)
Summary: Consultation Report  Consultation Report   Imported By: Dorise Hiss 10/07/2009 14:05:30  _____________________________________________________________________  External Attachment:    Type:   Image     Comment:   External Document

## 2010-04-27 NOTE — Consult Note (Signed)
Summary: Consultation Report/ CARDIOLOGY  Consultation Report/ CARDIOLOGY   Imported By: Dorise Hiss 10/07/2009 14:04:07  _____________________________________________________________________  External Attachment:    Type:   Image     Comment:   External Document

## 2010-04-27 NOTE — Progress Notes (Signed)
Summary: questions  Phone Note Call from Patient Call back at Home Phone [phone] Call back at [phone]   Reason for Call: Talk to Nurse Details for Reason: QUESTIONS ABOUT FOOD SHE CAN EAT Summary of Call: The daughter called confused about food for her mom.  She is also thinks that she needs an appointment to have her mom's coumadin Initial call taken by: Claudette Laws,  November 23, 2009 9:45 AM  Follow-up for Phone Call        Called and discussed foods with daughter. Questions answered.  Follow-up by: Vashti Hey RN,  November 23, 2009 10:54 AM

## 2010-04-27 NOTE — Letter (Signed)
Summary: External Correspondence/ OFFICE VISIT [facility]YS[facility]  External Correspondence/ OFFICE VISIT [facility]YS[facility]   Imported By: Dorise Hiss 01/11/2010 10:45:10  _____________________________________________________________________  External Attachment:    Type:   Image     Comment:   External Document

## 2010-04-27 NOTE — Assessment & Plan Note (Signed)
Summary: 3 mo fu per october reminder-srs   Visit Type:  Follow-up Primary Latoya Crane:  Dr. Selinda Crane   History of Present Illness: 77 year old woman presents for followup. She was seen back in July. Patient now back on Coumadin, having completed course of aspirin and Plavix. She reports being back at home since September, following rehabilitation stay in a nursing facility. She lives with her daughter, who arranges all medications. She is due for a visit with Dr. Dimas Crane tomorrow.  She denies any chest pain, reports NYHA class II dyspnea on exertion. She does complain of hip pain when she ambulates. She is not at all certain about whether this is worse on Crestor or not. She states her stools still look "dark," although she had been on iron supplements. These have been discontinued, not sure how long.  No palpitations. She has pacemaker followup scheduled with Dr. Ladona Crane in November in the [place] office.  Preventive Screening-Counseling & Management  Alcohol-Tobacco     Smoking Status: never  Current Medications (verified): 1)  Diltiazem Hcl 120 Mg Tabs (Diltiazem Hcl) .... Take 1 Tablet By Mouth Daily 2)  Warfarin Sodium 2 Mg Tabs (Warfarin Sodium) .... Use As Directed Per [facility] 3)  Hydrochlorothiazide 25 Mg Tabs (Hydrochlorothiazide) .... Take One Tablet By Mouth Daily. 4)  Aspir-Low 81 Mg Tbec (Aspirin) .... Take 1 Tablet By Mouth Once A Day 5)  Levothyroxine Sodium 50 Mcg Tabs (Levothyroxine Sodium) .... Take One Tablet By Mouth Once Daily. 6)  Lorazepam 0.5 Mg Tabs (Lorazepam) .... Take 1 Tablet By Mouth Three Times A Day As Needed 7)  Potassium Chloride Cr 10 Meq Cr-Tabs (Potassium Chloride) .... Take 2 Tablet By Mouth Once A Day 8)  Nitrostat 0.4 Mg Subl (Nitroglycerin) .Marland Kitchen.. 1 Tablet Under Tongue At Onset of Chest Pain; You May Repeat Every 5 Minutes For Up To 3 Doses. 9)  Crestor 10 Mg Tabs (Rosuvastatin Calcium) .... Take 1 Tablet By Mouth Once A Day 10)  Metoprolol  Tartrate 25 Mg Tabs (Metoprolol Tartrate) .... Take 1/2 Tablet By Mouth Two Times A Day 11)  Protonix 40 Mg Tbec (Pantoprazole Sodium) .... Take 1 Tablet By Mouth Once A Day 12)  Ensure  Liqd (Nutritional Supplements) .... One Can Three Times A Day 13)  Miralax  Powd (Polyethylene Glycol 3350) .Marland Kitchen.. 17gm Mixed With 8oz Water By Mouth Daily 14)  Dulcolax 10 Mg Supp (Bisacodyl) .... One Supp Pr Daily As Needed 15)  Mirtazapine 15 Mg Tabs (Mirtazapine) .... Take 1/2 Tablet By Mouth Once A Day At Bedtime  Allergies (verified): No Known Drug Allergies  Comments:  Nurse/Medical Assistant: The patient's medication bottles and allergies were reviewed with the patient and were updated in the Medication and Allergy Lists.  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 Tyrone Crane, LIMA to LAD, SVG to RCA  Social History: Last updated: 09/10/2009 Married  Tobacco Use - No Alcohol Use - no  Clinical Review Panels:  Echocardiogram Echocardiogram  Study Conclusions    - Left ventricle: The cavity size was normal. Wall thickness was     increased in a pattern of moderate LVH. Systolic function was     vigorous. The estimated ejection fraction  was in the range of 65%     to 70%. There was an LV mid-cavity gradient with peak of about 50     mmHg. Wall motion was normal; there were no regional wall motion     abnormalities. Doppler parameters are consistent with abnormal     left ventricular relaxation (grade 1 diastolic dysfunction).   - Aortic valve: There was no stenosis.   - Mitral valve:  Mild regurgitation.   - Left atrium: The atrium was mildly dilated.   - Right ventricle: The cavity size was normal. Pacer wire or     catheter noted in right ventricle. Systolic function was normal.   - Pulmonary arteries: PA peak pressure: 27mm Hg (S).   - Inferior vena cava: The vessel was normal in size; the     respirophasic diameter changes were in the normal range (= 50%);     findings are consistent with normal central venous pressure.   Impressions:    - Normal LV size with moderate LV hypertrophy. Vigorous systolic     function with small end systolic chamber leading to an LV     mid-cavity gradient that reaches as high as 50 mmHg peak. No wall     motion abnormality but the apex was not well-visualized. The RV     appeared normal in size and systolic function. Would suggest beta     blocker for LV mid-cavity gradient. (09/18/2009)    Review of Systems  The patient denies anorexia, fever, chest pain, syncope, dyspnea on exertion, peripheral edema, hemoptysis, melena, and hematochezia.         Reports some memory problems. Otherwise reviewed and negative except as already outlined.  Vital Signs:  Patient profile:   77 year old female Height:      59 inches Weight:      108 pounds Pulse rate:   60 / minute BP sitting:   116 / 63  (left arm) Cuff size:   regular  Vitals Entered By: Carlye Grippe (December 31, 2009 1:09 PM)  Physical Exam  Additional Exam:  GEN: atrial female, sitting upright, in no distress HEENT: NCAT,PERRLA,EOMI NECK: palpable pulses, no bruits; no JVD; no TM LUNGS: CTA bilaterally HEART: RRR (S1S2); soft, grade 2/6 systolic ejection murmur; no rubs; no gallops ABD: soft, NT; intact BS EXT: intact distal pulses; no edema SKIN: palid as before MUSC: no obvious deformity NEURO: A/O (x3)     EKG  Procedure date:  12/31/2009  Findings:      Magnet tracing shows regular paced rhythm at 98 beats per minute, without magnet atrial pacing at 60  beats per minute. Left ventricular hypertrophy with probable repolarization abnormalities.  PPM Specifications Following MD:  Lewayne Bunting, MD     PPM Vendor:  [employer] Jude     PPM Model Number:  [id number]     PPM Serial Number:  [id number] DOI:  01/17/2008     PPM Implanting MD:  Lewayne Bunting, MD  Lead 1    Location: [facility]     DOI: 01/17/2008     Model #: [id number]     Serial #: [id number]     Status: active Lead 2    Location: RV     DOI: 01/17/2008     Model #: 1788TC     Serial #: [id number]     Status: active   Indications:  TACHY-BRADY SYNDROME   PPM Follow Up Pacer Dependent:  No  Parameters Mode:  DDDR     Lower Rate Limit:  60     Upper Rate Limit:  110 Paced AV Delay:  250     Sensed AV Delay:  225  Impression & Recommendations:  Problem # 1:  CAD, NATIVE VESSEL (ICD-414.01)  CAD stable status post coronary artery bypass grafting in July. Patient back at home, looked after by her daughter in terms of medications and followup. Encouraged her to continue ambulation. She was on iron supplements following surgery related to anemia, still reporting some dark stools. Followup CBC is recommended. She has a visit with Dr. Dimas Crane tomorrow. Otherwise we will plan to see her back in 3 months.  Her updated medication list for this problem includes:    Diltiazem Hcl 120 Mg Tabs (Diltiazem hcl) .Marland Kitchen... Take 1 tablet by mouth daily    Warfarin Sodium 2 Mg Tabs (Warfarin sodium) ..... Use as directed per [facility]    Aspir-low 81 Mg Tbec (Aspirin) .Marland Kitchen... Take 1 tablet by mouth once a day    Nitrostat 0.4 Mg Subl (Nitroglycerin) .Marland Kitchen... 1 tablet under tongue at onset of chest pain; you may repeat every 5 minutes for up to 3 doses.    Metoprolol Tartrate 25 Mg Tabs (Metoprolol tartrate) .Marland Kitchen... Take 1/2 tablet by mouth two times a day  Problem # 2:  ATRIAL FIBRILLATION (ICD-427.31)  Patient completed course of aspirin and Plavix, now on aspirin and Coumadin, follow up the Coumadin clinic. She reports no  obvious active bleeding problems.  Her updated medication list for this problem includes:    Warfarin Sodium 2 Mg Tabs (Warfarin sodium) ..... Use as directed per [facility]    Aspir-low 81 Mg Tbec (Aspirin) .Marland Kitchen... Take 1 tablet by mouth once a day    Metoprolol Tartrate 25 Mg Tabs (Metoprolol tartrate) .Marland Kitchen... Take 1/2 tablet by mouth two times a day  Orders: T-Comprehensive Metabolic Panel [id number]) T-CBC No Diff ([id number]) T-Lipid Profile ([id number])  Problem # 3:  CARDIAC PACEMAKER IN SITU (ICD-V45.01)  Has followup for device interrogation with Dr. Ladona Crane in November.  Problem # 4:  DYSLIPIDEMIA (ICD-272.4)  Continues on Crestor. Not clear that hip pain is related, could be arthritic. Suggest followup fasting lipid profile and liver function tests.  Her updated medication list for this problem includes:    Crestor 10 Mg Tabs (Rosuvastatin calcium) .Marland Kitchen... Take 1 tablet by mouth once a day  Orders: T-Comprehensive Metabolic Panel ([id number]) T-CBC No Diff ([id number]) T-Lipid Profile ([id number])  Problem # 5:  HYPERTENSION, BENIGN (ICD-401.1)  Well-controlled.  Her updated medication list for this problem includes:    Diltiazem Hcl 120 Mg Tabs (Diltiazem hcl) .Marland Kitchen... Take 1 tablet by mouth daily    Hydrochlorothiazide 25 Mg Tabs (Hydrochlorothiazide) .Marland Kitchen... Take one tablet by mouth daily.    Aspir-low 81 Mg Tbec (Aspirin) .Marland Kitchen... Take 1 tablet by mouth once a day    Metoprolol Tartrate 25 Mg Tabs (Metoprolol tartrate) .Marland Kitchen... Take 1/2 tablet by mouth two times a day  Patient Instructions: 1)  Keep appointment on Monday, November 28th at 4:30pm with Dr. Ladona Crane in [place]. 2)  Have labs done tomorrow morning either at Dr. Jeannette How office or at the [facility]. Do not eat or drink after midnight. 3)  Your physician wants you to follow-up in: 3 months. You will receive a reminder letter in the mail one-two months in advance. If you don't receive a letter, please  call our office to schedule the follow-up appointment.

## 2010-04-27 NOTE — Assessment & Plan Note (Signed)
Summary: missed appt 6/3   Visit Type:  Follow-up Primary Provider:  Prentiss Bells   History of Present Illness: 77 year old female, patient of Dr. Diona Browner, with previously documented CAD, PAF with tachybradycardia syndrome, status post dual chamber pacemaker, on chronic Coumadin, now presents for post hospital followup.  Patient recently briefly hospitalized with atypical chest pain, ruled out for MI with normal cardiac markers, and is now referred directly from Dr. [name] for further evaluation.  Patient is a relatively difficult historian, and seems to suggest that her symptoms are related to anxiety. Of note, she reports relief after taking Ativan. However, she also suggest that these symptoms are new, and has been intermittent for the past week. Also of note, she denies any correlation with activity or exertion. She reportedly had a remote stress test, but none in recent past. Recent hospital EKGs indicate normal sinus rhythm with repolarization changes. With magnet, there is a controlled rate of approximately 100 bpm.  Preventive Screening-Counseling & Management  Alcohol-Tobacco     Smoking Status: never  Current Medications (verified): 1)  Diltiazem Hcl 120 Mg Tabs (Diltiazem Hcl) .... Take 1 Tablet By Mouth Two Times A Day 2)  Warfarin Sodium 2.5 Mg Tabs (Warfarin Sodium) .... Use As Directed [facility] 3)  Hydrochlorothiazide 25 Mg Tabs (Hydrochlorothiazide) .... Take One Tablet By Mouth Daily. 4)  Aspirin 81 Mg Tbec (Aspirin) .... Take One Tablet By Mouth Daily 5)  Levothyroxine Sodium 50 Mcg Tabs (Levothyroxine Sodium) .... Take One Tablet By Mouth Once Daily. 6)  Lorazepam 0.5 Mg Tabs (Lorazepam) .... As Needed 7)  Potassium Chloride Cr 10 Meq Cr-Tabs (Potassium Chloride) .... Take 2 Tablet By Mouth Once A Day  Allergies (verified): No Known Drug Allergies  Comments:  Nurse/Medical Assistant: The patient's medication bottles and allergies were  reviewed with the patient and were updated in the Medication and Allergy Lists.  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 All other systems negative.   Vital Signs:  Patient profile:   77 year old female Height:      59 inches Weight:      107 pounds Pulse rate:   76 / minute BP sitting:   153 / 66  (left arm) Cuff size:   regular  Vitals Entered By: Carlye Grippe (August 31, 2009 2:13 PM)   Physical Exam  Additional Exam:  GEN:77 year old female, sitting up right, no distress HEENT: NCAT,PERRLA,EOMI NECK: palpable pulses, no bruits; no JVD; no TM LUNGS: CTA bilaterally HEART: RRR (S1S2); grade 2/6 short systolic ejection murmur at the base; no rubs; no gallops ABD: soft, NT; intact BS EXT: intact distal pulses; trace edema SKIN: warm, dry MUSC: no obvious deformity NEURO: A/O (x3)     PPM Specifications Following MD:  Lewayne Bunting, MD     PPM Vendor:  [employer]     PPM Model Number:  [id number]     PPM Serial Number:  [id number] DOI:  01/17/2008     PPM Implanting MD:  Lewayne Bunting, MD  Lead 1    Location: [facility]     DOI: 01/17/2008     Model #: [id number]     Serial #: [id number]     Status: active Lead 2  Location: [facility]     DOI: 01/17/2008     Model #: [id number]     Serial #: [id number]     Status: active   Indications:  TACHY-BRADY SYNDROME   PPM Follow Up Pacer Dependent:  No      Parameters Mode:  DDDR     Lower Rate Limit:  60     Upper Rate Limit:  110 Paced AV Delay:  250     Sensed AV Delay:  225  Impression & Recommendations:  Problem # 1:  CHEST PAIN (ICD-786.50) Will schedule an exercise stress Cardiolite for risk  stratification. Patient presents with atypical symptoms, but does have known CAD, as previously outlined. She was previously felt to have a fairly high risk for PCI. She denies any recent ischemic evaluation. Despite a history absent of exertional symptoms, we will keep a low threshold for a repeat cardiac catheterization. We'll plan early clinic follow up with myself and Dr. Diona Browner for review of study results, and further recommendations. In the meanwhile, patient has been provided a  prescription for nitroglycerin, and instructions to proceed directly to the ED, in the event of unrelieved or worsening chest pain.  Problem # 2:  CARDIAC PACEMAKER IN SITU (ICD-V45.01) patient is scheduled to return to Dr. Sharrell Ku in October of this year, at our [place] clinic.  Problem # 3:  HYPERTENSION, BENIGN (ICD-401.1) continued close monitoring recommended, on current medication regimen.  Problem # 4:  DYSLIPIDEMIA (ICD-272.4) previously documented intolerance to numerous statins, including Crestor, which was recommended at time of last clinic visit. Will defer to Dr. [name] for ongoing monitoring and management, with target LDL of 70 was, if feasible.  Other Orders: Nuclear Med (Nuc Med)  Patient Instructions: 1)  FOLLOW UP APPT WITH DR. MCDOWELL ON THURSDAY, September 10, 2009 AT 2PM. 2)  Your physician has requested that you have an exercise stress cardiolite.  For further information please visit [email or web address]  Please follow instruction sheet, as given. 3)  Your physician recommended you take 1 tablet (or 1 spray) under tongue at onset of chest pain; you may repeat every 5 minutes for up to 3 doses. If 3 or more doses are required, call 911 and proceed to the ER immediately. Prescriptions: NITROSTAT 0.4 MG SUBL (NITROGLYCERIN) 1 tablet under tongue at onset of chest pain; you may repeat every 5 minutes for up to 3 doses.  #25 x 3   Entered by:   Cyril Loosen, RN, BSN   Authorized by:    Nelida Meuse, PA-C   Signed by:   Cyril Loosen, RN, BSN on 08/31/2009   Method used:   Electronically to        [facility] [address] (retail)       [address]       [place], [place]  [address]       Ph: [phone] or [phone]       Fax: [phone]   RxID:   [id number]   Handout requested.

## 2010-04-27 NOTE — Progress Notes (Signed)
Summary: concerned about therapy and length of stay at nursing center  Phone Note Call from Patient Call back at [phone]   Summary of Call: Pt's dgt called stating pt felt she was more weak now than she saw at last OV. She told her dgt she felt like she was getting weaker all the time. She has also had a couple of dizzy spells. Pt's dgt is concerned that pt is walking enough and this is why she feels weaker. She states pt can't walk w/o assistancce. She would like to know if pt's therapy should be increased to 2 or more times per day instead ov once daily. Also she is concerned about how long her mother will stay in the nursing center. She states insurance will only pay for 20 days. Notified pt's dgt that surgeon will usually make decision/give orders for nursing facility and therapy. She will discuss with [facility]. Initial call taken by: Cyril Loosen, RN, BSN,  October 12, 2009 12:22 PM

## 2010-04-27 NOTE — Cardiovascular Report (Signed)
Summary: Cardiac Catheterization  Cardiac Catheterization   Imported By: Dorise Hiss 10/07/2009 14:08:49  _____________________________________________________________________  External Attachment:    Type:   Image     Comment:   External Document

## 2010-04-27 NOTE — Medication Information (Signed)
Summary: Coumadin Clinic  Anticoagulant Therapy  Managed by: Weston Brass, PharmD PCP: Dr. Selinda Flavin Supervising MD: Clifton James MD, Cristal Deer Indication 1: Atrial Fibrillation Lab Used: [facility] Site: [place] INR POC 1.77 INR RANGE 2.0-3.0  Dietary changes: no    Health status changes: no    Bleeding/hemorrhagic complications: no    Recent/future hospitalizations: no    Any changes in medication regimen? no    Recent/future dental: no  Any missed doses?: no       Is patient compliant with meds? yes       Allergies: No Known Drug Allergies  Anticoagulation Management History:      Her anticoagulation is being managed by telephone today.  She is being anticoagulated due to atrial fibrillation.  Positive risk factors for bleeding include an age of 75 years or older.  The bleeding index is 'intermediate risk'.  Positive CHADS2 values include History of HTN and Age > 77 years old.  The start date was 10/13/2009.  Her last INR was 6.28.  Anticoagulation responsible provider: Clifton James MD, Cristal Deer.  INR POC: 1.77.    Anticoagulation Management Assessment/Plan:      The patient's current anticoagulation dose is Warfarin sodium 5 mg tabs: Use as directed by Anticoagulation Clinic.  The target INR is 2.0-3.0.  The next INR is due 11/06/2009.  Anticoagulation instructions were given to [facility].  Results were reviewed/authorized by Weston Brass, PharmD.  She was notified by Weston Brass PharmD.         Prior Anticoagulation Instructions: Called with resuls of PT/INR obtained on pt today.  Coumadin has been on hold since 8/2 for INR of 6.75.   Today PT 55.1  INR 6.28   RN at [facility] denies pt being on any new meds.  Requested med sheet be faxed to me for review. After review Med sheet indicates that coumadin was held on 8/2 & 8/3.  Pt is on Levothyroxine, Diltiazem and Protonix.  Order given for pt to hold coumadin thru 8/7 and repeat INR on 11/02/09.  Staff to  monitor closely for s/s of bleeding.  Current Anticoagulation Instructions: INR 1.77   Decrease dose to 2.5mg  every day except 5mg  on Monday, Wednesday and Friday.  Recheck INR on Friday.  Spoke with Charity fundraiser at [facility].

## 2010-04-27 NOTE — Progress Notes (Signed)
Summary: coumadin management  Phone Note Other Incoming   Caller: Trisha @ [facility] Reason for Call: Discuss lab or test results Summary of Call: Called with results of PT/INR obtained on pt today.  PT 27.7  INR 2.61  Order given for pt to continue coumadin 5mg  once daily and recheck INR 10/26/09 with results to our office.     Anticoagulant Therapy  Managed by: Vashti Hey RN PCP: Dr. Selinda Flavin Supervising MD: Dietrich Pates MD, Molly Maduro Indication 1: Atrial Fibrillation Lab Used: [facility] Site: [place] PT 27.7  Dietary changes: no    Health status changes: no    Bleeding/hemorrhagic complications: no    Recent/future hospitalizations: no    Any changes in medication regimen? no    Recent/future dental: no  Any missed doses?: no       Is patient compliant with meds? yes         Anticoagulation Management History:      Her anticoagulation is being managed by telephone today.  She is being anticoagulated due to atrial fibrillation.  Positive risk factors for bleeding include an age of 75 years or older.  The bleeding index is 'intermediate risk'.  Positive CHADS2 values include History of HTN and Age > 28 years old.  The start date was 10/13/2009.  Her last INR was 1.61 and today's INR is 2.61.  Prothrombin time is 27.7.  Anticoagulation responsible provider: Dietrich Pates MD, Molly Maduro.    Anticoagulation Management Assessment/Plan:      The patient's current anticoagulation dose is Warfarin sodium 5 mg tabs: Use as directed by Anticoagulation Clinic.  The target INR is 2.0-3.0.  The next INR is due 10/26/2009.  Anticoagulation instructions were given to [facility].  Results were reviewed/authorized by Vashti Hey RN.  She was notified by [facility].         Prior Anticoagulation Instructions: Called with results of PT/INR obtained on pt today.  PT 19.0  INR 1.61  She was started on couamdin 5mg  once daily on 10/13/09.  Order given for pt to continue  coumadin 5mg  once daily and recheck INR on 10/19/09.  Current Anticoagulation Instructions: Called with results of PT/INR obtained on pt today.  PT 27.7  INR 2.61  Order given for pt to continue coumadin 5mg  once daily and recheck INR 10/26/09 with results to our office.

## 2010-04-29 NOTE — Miscellaneous (Signed)
Summary: Orders Update  Clinical Lists Changes  Orders: Added new Test order of T-Lipid Profile ([id number]) - Signed Added new Test order of T-Hepatic Function ([id number]) - Signed 

## 2010-06-13 LAB — BASIC METABOLIC PANEL
BUN: 10 mg/dL (ref 6–23)
BUN: 13 mg/dL (ref 6–23)
BUN: 13 mg/dL (ref 6–23)
BUN: 14 mg/dL (ref 6–23)
BUN: 14 mg/dL (ref 6–23)
BUN: 18 mg/dL (ref 6–23)
BUN: 14 mg/dL (ref 6–23)
BUN: 14 mg/dL (ref 6–23)
BUN: 16 mg/dL (ref 6–23)
CO2: 20 mEq/L (ref 19–32)
CO2: 21 mEq/L (ref 19–32)
CO2: 25 mEq/L (ref 19–32)
CO2: 26 mEq/L (ref 19–32)
CO2: 28 mEq/L (ref 19–32)
CO2: 29 mEq/L (ref 19–32)
CO2: 22 mEq/L (ref 19–32)
CO2: 23 mEq/L (ref 19–32)
CO2: 23 mEq/L (ref 19–32)
CO2: 23 mEq/L (ref 19–32)
CO2: 25 mEq/L (ref 19–32)
CO2: 25 mEq/L (ref 19–32)
CO2: 26 mEq/L (ref 19–32)
CO2: 27 mEq/L (ref 19–32)
Calcium: 7.5 mg/dL — ABNORMAL LOW (ref 8.4–10.5)
Calcium: 7.8 mg/dL — ABNORMAL LOW (ref 8.4–10.5)
Calcium: 7.8 mg/dL — ABNORMAL LOW (ref 8.4–10.5)
Calcium: 8 mg/dL — ABNORMAL LOW (ref 8.4–10.5)
Calcium: 8.2 mg/dL — ABNORMAL LOW (ref 8.4–10.5)
Calcium: 8.3 mg/dL — ABNORMAL LOW (ref 8.4–10.5)
Calcium: 8.5 mg/dL (ref 8.4–10.5)
Calcium: 8.7 mg/dL (ref 8.4–10.5)
Calcium: 8.8 mg/dL (ref 8.4–10.5)
Calcium: 9.1 mg/dL (ref 8.4–10.5)
Chloride: 100 mEq/L (ref 96–112)
Chloride: 103 mEq/L (ref 96–112)
Chloride: 92 mEq/L — ABNORMAL LOW (ref 96–112)
Chloride: 97 mEq/L (ref 96–112)
Chloride: 98 mEq/L (ref 96–112)
Chloride: 99 mEq/L (ref 96–112)
Chloride: 100 mEq/L (ref 96–112)
Chloride: 100 mEq/L (ref 96–112)
Chloride: 101 mEq/L (ref 96–112)
Chloride: 102 mEq/L (ref 96–112)
Chloride: 102 mEq/L (ref 96–112)
Chloride: 99 mEq/L (ref 96–112)
Creatinine, Ser: 0.91 mg/dL (ref 0.4–1.2)
Creatinine, Ser: 0.94 mg/dL (ref 0.4–1.2)
Creatinine, Ser: 1 mg/dL (ref 0.4–1.2)
Creatinine, Ser: 1.02 mg/dL (ref 0.4–1.2)
Creatinine, Ser: 1.06 mg/dL (ref 0.4–1.2)
Creatinine, Ser: 1.13 mg/dL (ref 0.4–1.2)
Creatinine, Ser: 0.92 mg/dL (ref 0.4–1.2)
Creatinine, Ser: 1.03 mg/dL (ref 0.4–1.2)
Creatinine, Ser: 1.07 mg/dL (ref 0.4–1.2)
Creatinine, Ser: 1.09 mg/dL (ref 0.4–1.2)
Creatinine, Ser: 1.1 mg/dL (ref 0.4–1.2)
Creatinine, Ser: 1.17 mg/dL (ref 0.4–1.2)
GFR calc Af Amer: 56 mL/min — ABNORMAL LOW (ref >60)
GFR calc Af Amer: 60 mL/min — ABNORMAL LOW (ref >60)
GFR calc Af Amer: >60 mL/min (ref >60)
GFR calc Af Amer: >60 mL/min (ref >60)
GFR calc Af Amer: >60 mL/min (ref >60)
GFR calc Af Amer: >60 mL/min (ref >60)
GFR calc Af Amer: 57 mL/min — ABNORMAL LOW (ref >60)
GFR calc Af Amer: 58 mL/min — ABNORMAL LOW (ref >60)
GFR calc Af Amer: 59 mL/min — ABNORMAL LOW (ref >60)
GFR calc Af Amer: 60 mL/min — ABNORMAL LOW (ref >60)
GFR calc Af Amer: >60 mL/min (ref >60)
GFR calc Af Amer: >60 mL/min (ref >60)
GFR calc Af Amer: >60 mL/min (ref >60)
GFR calc non Af Amer: 46 mL/min — ABNORMAL LOW (ref >60)
GFR calc non Af Amer: 50 mL/min — ABNORMAL LOW (ref >60)
GFR calc non Af Amer: 52 mL/min — ABNORMAL LOW (ref >60)
GFR calc non Af Amer: 53 mL/min — ABNORMAL LOW (ref >60)
GFR calc non Af Amer: 57 mL/min — ABNORMAL LOW (ref >60)
GFR calc non Af Amer: 59 mL/min — ABNORMAL LOW (ref >60)
GFR calc non Af Amer: 49 mL/min — ABNORMAL LOW (ref >60)
GFR calc non Af Amer: 50 mL/min — ABNORMAL LOW (ref >60)
GFR calc non Af Amer: 51 mL/min — ABNORMAL LOW (ref >60)
GFR calc non Af Amer: 51 mL/min — ABNORMAL LOW (ref >60)
Glucose, Bld: 133 mg/dL — ABNORMAL HIGH (ref 70–99)
Glucose, Bld: 139 mg/dL — ABNORMAL HIGH (ref 70–99)
Glucose, Bld: 144 mg/dL — ABNORMAL HIGH (ref 70–99)
Glucose, Bld: 93 mg/dL (ref 70–99)
Glucose, Bld: 94 mg/dL (ref 70–99)
Glucose, Bld: 95 mg/dL (ref 70–99)
Glucose, Bld: 110 mg/dL — ABNORMAL HIGH (ref 70–99)
Glucose, Bld: 113 mg/dL — ABNORMAL HIGH (ref 70–99)
Potassium: 3.1 mEq/L — ABNORMAL LOW (ref 3.5–5.1)
Potassium: 3.4 mEq/L — ABNORMAL LOW (ref 3.5–5.1)
Potassium: 3.5 mEq/L (ref 3.5–5.1)
Potassium: 3.8 mEq/L (ref 3.5–5.1)
Potassium: 4.1 mEq/L (ref 3.5–5.1)
Potassium: 4.4 mEq/L (ref 3.5–5.1)
Potassium: 4 mEq/L (ref 3.5–5.1)
Potassium: 4.1 mEq/L (ref 3.5–5.1)
Potassium: 4.2 mEq/L (ref 3.5–5.1)
Potassium: 4.4 mEq/L (ref 3.5–5.1)
Sodium: 129 mEq/L — ABNORMAL LOW (ref 135–145)
Sodium: 130 mEq/L — ABNORMAL LOW (ref 135–145)
Sodium: 131 mEq/L — ABNORMAL LOW (ref 135–145)
Sodium: 132 mEq/L — ABNORMAL LOW (ref 135–145)
Sodium: 133 mEq/L — ABNORMAL LOW (ref 135–145)
Sodium: 135 mEq/L (ref 135–145)
Sodium: 131 mEq/L — ABNORMAL LOW (ref 135–145)
Sodium: 132 mEq/L — ABNORMAL LOW (ref 135–145)
Sodium: 132 mEq/L — ABNORMAL LOW (ref 135–145)
Sodium: 133 mEq/L — ABNORMAL LOW (ref 135–145)
Sodium: 134 mEq/L — ABNORMAL LOW (ref 135–145)
Sodium: 134 mEq/L — ABNORMAL LOW (ref 135–145)
Sodium: 135 mEq/L (ref 135–145)

## 2010-06-13 LAB — HEPATIC FUNCTION PANEL
ALT: 176 U/L — ABNORMAL HIGH (ref 0–35)
AST: 222 U/L — ABNORMAL HIGH (ref 0–37)
Albumin: 2.9 g/dL — ABNORMAL LOW (ref 3.5–5.2)
Alkaline Phosphatase: 59 U/L (ref 39–117)
Bilirubin, Direct: 0.2 mg/dL (ref 0.0–0.3)
Indirect Bilirubin: 0.4 mg/dL (ref 0.3–0.9)
Total Bilirubin: 0.6 mg/dL (ref 0.3–1.2)
Total Protein: 5.3 g/dL — ABNORMAL LOW (ref 6.0–8.3)

## 2010-06-13 LAB — CBC
HCT: 26.4 % — ABNORMAL LOW (ref 36.0–46.0)
HCT: 27.5 % — ABNORMAL LOW (ref 36.0–46.0)
HCT: 28.9 % — ABNORMAL LOW (ref 36.0–46.0)
HCT: 28.9 % — ABNORMAL LOW (ref 36.0–46.0)
HCT: 29.5 % — ABNORMAL LOW (ref 36.0–46.0)
HCT: 30.1 % — ABNORMAL LOW (ref 36.0–46.0)
HCT: 30.3 % — ABNORMAL LOW (ref 36.0–46.0)
HCT: 30.8 % — ABNORMAL LOW (ref 36.0–46.0)
HCT: 36.2 % (ref 36.0–46.0)
HCT: 32.5 % — ABNORMAL LOW (ref 36.0–46.0)
HCT: 32.9 % — ABNORMAL LOW (ref 36.0–46.0)
HCT: 35.6 % — ABNORMAL LOW (ref 36.0–46.0)
HCT: 37.1 % (ref 36.0–46.0)
Hemoglobin: 10 g/dL — ABNORMAL LOW (ref 12.0–15.0)
Hemoglobin: 10.1 g/dL — ABNORMAL LOW (ref 12.0–15.0)
Hemoglobin: 10.5 g/dL — ABNORMAL LOW (ref 12.0–15.0)
Hemoglobin: 10.7 g/dL — ABNORMAL LOW (ref 12.0–15.0)
Hemoglobin: 12.6 g/dL (ref 12.0–15.0)
Hemoglobin: 9 g/dL — ABNORMAL LOW (ref 12.0–15.0)
Hemoglobin: 9.3 g/dL — ABNORMAL LOW (ref 12.0–15.0)
Hemoglobin: 9.9 g/dL — ABNORMAL LOW (ref 12.0–15.0)
Hemoglobin: 9.9 g/dL — ABNORMAL LOW (ref 12.0–15.0)
Hemoglobin: 10.6 g/dL — ABNORMAL LOW (ref 12.0–15.0)
Hemoglobin: 11.3 g/dL — ABNORMAL LOW (ref 12.0–15.0)
Hemoglobin: 11.6 g/dL — ABNORMAL LOW (ref 12.0–15.0)
Hemoglobin: 11.8 g/dL — ABNORMAL LOW (ref 12.0–15.0)
Hemoglobin: 12.1 g/dL (ref 12.0–15.0)
Hemoglobin: 12.7 g/dL (ref 12.0–15.0)
MCH: 30 pg (ref 26.0–34.0)
MCH: 30 pg (ref 26.0–34.0)
MCH: 30 pg (ref 26.0–34.0)
MCH: 30.2 pg (ref 26.0–34.0)
MCH: 30.4 pg (ref 26.0–34.0)
MCH: 30.4 pg (ref 26.0–34.0)
MCH: 30.4 pg (ref 26.0–34.0)
MCH: 30.4 pg (ref 26.0–34.0)
MCH: 30.6 pg (ref 26.0–34.0)
MCH: 30.1 pg (ref 26.0–34.0)
MCH: 30.2 pg (ref 26.0–34.0)
MCH: 30.2 pg (ref 26.0–34.0)
MCH: 30.7 pg (ref 26.0–34.0)
MCHC: 33.5 g/dL (ref 30.0–36.0)
MCHC: 33.6 g/dL (ref 30.0–36.0)
MCHC: 33.8 g/dL (ref 30.0–36.0)
MCHC: 34.1 g/dL (ref 30.0–36.0)
MCHC: 34.4 g/dL (ref 30.0–36.0)
MCHC: 34.5 g/dL (ref 30.0–36.0)
MCHC: 34.7 g/dL (ref 30.0–36.0)
MCHC: 34.7 g/dL (ref 30.0–36.0)
MCHC: 34.8 g/dL (ref 30.0–36.0)
MCHC: 34.3 g/dL (ref 30.0–36.0)
MCHC: 34.5 g/dL (ref 30.0–36.0)
MCHC: 35 g/dL (ref 30.0–36.0)
MCHC: 35 g/dL (ref 30.0–36.0)
MCV: 87.6 fL (ref 78.0–100.0)
MCV: 87.7 fL (ref 78.0–100.0)
MCV: 88.1 fL (ref 78.0–100.0)
MCV: 88.1 fL (ref 78.0–100.0)
MCV: 88.2 fL (ref 78.0–100.0)
MCV: 88.6 fL (ref 78.0–100.0)
MCV: 88.7 fL (ref 78.0–100.0)
MCV: 89.6 fL (ref 78.0–100.0)
MCV: 89.9 fL (ref 78.0–100.0)
MCV: 86.3 fL (ref 78.0–100.0)
MCV: 86.4 fL (ref 78.0–100.0)
MCV: 87 fL (ref 78.0–100.0)
MCV: 87.2 fL (ref 78.0–100.0)
MCV: 87.3 fL (ref 78.0–100.0)
MCV: 87.6 fL (ref 78.0–100.0)
Platelets: 194 10*3/uL (ref 150–400)
Platelets: 223 10*3/uL (ref 150–400)
Platelets: 226 10*3/uL (ref 150–400)
Platelets: 249 10*3/uL (ref 150–400)
Platelets: 281 10*3/uL (ref 150–400)
Platelets: 285 10*3/uL (ref 150–400)
Platelets: 327 10*3/uL (ref 150–400)
Platelets: 339 10*3/uL (ref 150–400)
Platelets: 352 10*3/uL (ref 150–400)
Platelets: 211 10*3/uL (ref 150–400)
Platelets: 253 10*3/uL (ref 150–400)
Platelets: 280 10*3/uL (ref 150–400)
Platelets: 289 10*3/uL (ref 150–400)
Platelets: 325 10*3/uL (ref 150–400)
Platelets: 337 10*3/uL (ref 150–400)
Platelets: 344 10*3/uL (ref 150–400)
Platelets: 366 10*3/uL (ref 150–400)
RBC: 3 MIL/uL — ABNORMAL LOW (ref 3.87–5.11)
RBC: 3.1 MIL/uL — ABNORMAL LOW (ref 3.87–5.11)
RBC: 3.27 MIL/uL — ABNORMAL LOW (ref 3.87–5.11)
RBC: 3.28 MIL/uL — ABNORMAL LOW (ref 3.87–5.11)
RBC: 3.28 MIL/uL — ABNORMAL LOW (ref 3.87–5.11)
RBC: 3.36 MIL/uL — ABNORMAL LOW (ref 3.87–5.11)
RBC: 3.46 MIL/uL — ABNORMAL LOW (ref 3.87–5.11)
RBC: 3.5 MIL/uL — ABNORMAL LOW (ref 3.87–5.11)
RBC: 4.13 MIL/uL (ref 3.87–5.11)
RBC: 3.42 MIL/uL — ABNORMAL LOW (ref 3.87–5.11)
RBC: 3.76 MIL/uL — ABNORMAL LOW (ref 3.87–5.11)
RBC: 3.83 MIL/uL — ABNORMAL LOW (ref 3.87–5.11)
RBC: 3.86 MIL/uL — ABNORMAL LOW (ref 3.87–5.11)
RBC: 3.98 MIL/uL (ref 3.87–5.11)
RBC: 4.01 MIL/uL (ref 3.87–5.11)
RBC: 4.09 MIL/uL (ref 3.87–5.11)
RDW: 14.5 % (ref 11.5–15.5)
RDW: 14.6 % (ref 11.5–15.5)
RDW: 14.6 % (ref 11.5–15.5)
RDW: 14.8 % (ref 11.5–15.5)
RDW: 15 % (ref 11.5–15.5)
RDW: 15 % (ref 11.5–15.5)
RDW: 15 % (ref 11.5–15.5)
RDW: 15.1 % (ref 11.5–15.5)
RDW: 15.1 % (ref 11.5–15.5)
RDW: 14.4 % (ref 11.5–15.5)
RDW: 14.5 % (ref 11.5–15.5)
WBC: 10.9 10*3/uL — ABNORMAL HIGH (ref 4.0–10.5)
WBC: 11.5 10*3/uL — ABNORMAL HIGH (ref 4.0–10.5)
WBC: 13.5 10*3/uL — ABNORMAL HIGH (ref 4.0–10.5)
WBC: 16.8 10*3/uL — ABNORMAL HIGH (ref 4.0–10.5)
WBC: 16.9 10*3/uL — ABNORMAL HIGH (ref 4.0–10.5)
WBC: 18.7 10*3/uL — ABNORMAL HIGH (ref 4.0–10.5)
WBC: 8.2 10*3/uL (ref 4.0–10.5)
WBC: 8.5 10*3/uL (ref 4.0–10.5)
WBC: 9.1 10*3/uL (ref 4.0–10.5)
WBC: 3.6 10*3/uL — ABNORMAL LOW (ref 4.0–10.5)
WBC: 3.9 10*3/uL — ABNORMAL LOW (ref 4.0–10.5)
WBC: 4.2 10*3/uL (ref 4.0–10.5)
WBC: 4.6 10*3/uL (ref 4.0–10.5)
WBC: 4.7 10*3/uL (ref 4.0–10.5)
WBC: 4.8 10*3/uL (ref 4.0–10.5)
WBC: 5.9 10*3/uL (ref 4.0–10.5)
WBC: 8.1 10*3/uL (ref 4.0–10.5)

## 2010-06-13 LAB — DIFFERENTIAL
Basophils Absolute: 0 10*3/uL (ref 0.0–0.1)
Basophils Relative: 0 % (ref 0–1)
Eosinophils Absolute: 0.1 10*3/uL (ref 0.0–0.7)
Eosinophils Relative: 1 % (ref 0–5)
Monocytes Absolute: 0.6 10*3/uL (ref 0.1–1.0)

## 2010-06-13 LAB — POCT I-STAT 3, ART BLOOD GAS (G3+)
Acid-base deficit: 1 mmol/L (ref 0.0–2.0)
Acid-base deficit: 3 mmol/L — ABNORMAL HIGH (ref 0.0–2.0)
Acid-base deficit: 4 mmol/L — ABNORMAL HIGH (ref 0.0–2.0)
Acid-base deficit: 8 mmol/L — ABNORMAL HIGH (ref 0.0–2.0)
Bicarbonate: 18.3 mEq/L — ABNORMAL LOW (ref 20.0–24.0)
Bicarbonate: 19.8 mEq/L — ABNORMAL LOW (ref 20.0–24.0)
Bicarbonate: 22.5 mEq/L (ref 20.0–24.0)
O2 Saturation: 90 %
O2 Saturation: 96 %
O2 Saturation: 99 %
Patient temperature: 33.3
Patient temperature: 33.7
Patient temperature: 35.1
TCO2: 23 mmol/L (ref 0–100)
TCO2: 24 mmol/L (ref 0–100)
TCO2: 25 mmol/L (ref 0–100)
pCO2 arterial: 26.7 mmHg — ABNORMAL LOW (ref 35.0–45.0)
pCO2 arterial: 40.6 mmHg (ref 35.0–45.0)
pH, Arterial: 7.386 (ref 7.350–7.400)
pH, Arterial: 7.449 — ABNORMAL HIGH (ref 7.350–7.400)
pH, Arterial: 7.511 — ABNORMAL HIGH (ref 7.350–7.400)
pO2, Arterial: 138 mmHg — ABNORMAL HIGH (ref 80.0–100.0)
pO2, Arterial: 441 mmHg — ABNORMAL HIGH (ref 80.0–100.0)
pO2, Arterial: 58 mmHg — ABNORMAL LOW (ref 80.0–100.0)
pO2, Arterial: 78 mmHg — ABNORMAL LOW (ref 80.0–100.0)

## 2010-06-13 LAB — GLUCOSE, CAPILLARY
Glucose-Capillary: 101 mg/dL — ABNORMAL HIGH (ref 70–99)
Glucose-Capillary: 107 mg/dL — ABNORMAL HIGH (ref 70–99)
Glucose-Capillary: 109 mg/dL — ABNORMAL HIGH (ref 70–99)
Glucose-Capillary: 110 mg/dL — ABNORMAL HIGH (ref 70–99)
Glucose-Capillary: 118 mg/dL — ABNORMAL HIGH (ref 70–99)
Glucose-Capillary: 125 mg/dL — ABNORMAL HIGH (ref 70–99)
Glucose-Capillary: 136 mg/dL — ABNORMAL HIGH (ref 70–99)
Glucose-Capillary: 138 mg/dL — ABNORMAL HIGH (ref 70–99)
Glucose-Capillary: 142 mg/dL — ABNORMAL HIGH (ref 70–99)
Glucose-Capillary: 162 mg/dL — ABNORMAL HIGH (ref 70–99)
Glucose-Capillary: 79 mg/dL (ref 70–99)
Glucose-Capillary: 87 mg/dL (ref 70–99)
Glucose-Capillary: 87 mg/dL (ref 70–99)
Glucose-Capillary: 88 mg/dL (ref 70–99)
Glucose-Capillary: 96 mg/dL (ref 70–99)
Glucose-Capillary: 96 mg/dL (ref 70–99)
Glucose-Capillary: 99 mg/dL (ref 70–99)

## 2010-06-13 LAB — BLOOD GAS, ARTERIAL
Acid-base deficit: 0.7 mmol/L (ref 0.0–2.0)
Bicarbonate: 23.1 mEq/L (ref 20.0–24.0)
Drawn by: 324701
FIO2: 0.21 %
O2 Saturation: 99.8 %
Patient temperature: 98.6
TCO2: 24.1 mmol/L (ref 0–100)
pCO2 arterial: 35.6 mmHg (ref 35.0–45.0)
pH, Arterial: 7.427 — ABNORMAL HIGH (ref 7.350–7.400)
pO2, Arterial: 135 mmHg — ABNORMAL HIGH (ref 80.0–100.0)

## 2010-06-13 LAB — TYPE AND SCREEN
ABO/RH(D): O POS
Antibody Screen: NEGATIVE

## 2010-06-13 LAB — TROPONIN I
Troponin I: 0.04 ng/mL (ref 0.00–0.06)
Troponin I: 0.07 ng/mL — ABNORMAL HIGH (ref 0.00–0.06)

## 2010-06-13 LAB — CARDIAC PANEL(CRET KIN+CKTOT+MB+TROPI)
CK, MB: 1.9 ng/mL (ref 0.3–4.0)
CK, MB: 2.2 ng/mL (ref 0.3–4.0)
CK, MB: 2.7 ng/mL (ref 0.3–4.0)
CK, MB: 2.7 ng/mL (ref 0.3–4.0)
Relative Index: 1.9 (ref 0.0–2.5)
Relative Index: INVALID (ref 0.0–2.5)
Relative Index: INVALID (ref 0.0–2.5)
Relative Index: INVALID (ref 0.0–2.5)
Total CK: 66 U/L (ref 7–177)
Total CK: 88 U/L (ref 7–177)
Troponin I: 0.04 ng/mL (ref 0.00–0.06)
Troponin I: 0.06 ng/mL (ref 0.00–0.06)

## 2010-06-13 LAB — COMPREHENSIVE METABOLIC PANEL
AST: 36 U/L (ref 0–37)
Albumin: 3.9 g/dL (ref 3.5–5.2)
Alkaline Phosphatase: 108 U/L (ref 39–117)
CO2: 27 mEq/L (ref 19–32)
Chloride: 98 mEq/L (ref 96–112)
GFR calc Af Amer: 59 mL/min — ABNORMAL LOW (ref >60)
Potassium: 4.2 mEq/L (ref 3.5–5.1)
Total Bilirubin: 0.4 mg/dL (ref 0.3–1.2)

## 2010-06-13 LAB — POCT I-STAT, CHEM 8
Calcium, Ion: 1.16 mmol/L (ref 1.12–1.32)
Calcium, Ion: 1.21 mmol/L (ref 1.12–1.32)
Chloride: 105 mEq/L (ref 96–112)
HCT: 29 % — ABNORMAL LOW (ref 36.0–46.0)
HCT: 35 % — ABNORMAL LOW (ref 36.0–46.0)
Potassium: 3.8 mEq/L (ref 3.5–5.1)
Sodium: 132 mEq/L — ABNORMAL LOW (ref 135–145)
TCO2: 25 mmol/L (ref 0–100)

## 2010-06-13 LAB — POCT I-STAT 4, (NA,K, GLUC, HGB,HCT)
Glucose, Bld: 85 mg/dL (ref 70–99)
Glucose, Bld: 97 mg/dL (ref 70–99)
Glucose, Bld: 99 mg/dL (ref 70–99)
HCT: 26 % — ABNORMAL LOW (ref 36.0–46.0)
HCT: 31 % — ABNORMAL LOW (ref 36.0–46.0)
Hemoglobin: 10.5 g/dL — ABNORMAL LOW (ref 12.0–15.0)
Hemoglobin: 8.8 g/dL — ABNORMAL LOW (ref 12.0–15.0)
Hemoglobin: 9.2 g/dL — ABNORMAL LOW (ref 12.0–15.0)
Potassium: 3.6 mEq/L (ref 3.5–5.1)
Potassium: 3.6 mEq/L (ref 3.5–5.1)
Potassium: 3.7 mEq/L (ref 3.5–5.1)
Sodium: 133 mEq/L — ABNORMAL LOW (ref 135–145)

## 2010-06-13 LAB — TSH: TSH: 12.327 u[IU]/mL — ABNORMAL HIGH (ref 0.350–4.500)

## 2010-06-13 LAB — URINALYSIS, ROUTINE W REFLEX MICROSCOPIC
Bilirubin Urine: NEGATIVE
Glucose, UA: NEGATIVE mg/dL
Nitrite: NEGATIVE
Specific Gravity, Urine: 1.016 (ref 1.005–1.030)
pH: 7 (ref 5.0–8.0)

## 2010-06-13 LAB — CREATININE, SERUM
Creatinine, Ser: 0.74 mg/dL (ref 0.4–1.2)
Creatinine, Ser: 0.79 mg/dL (ref 0.4–1.2)
GFR calc Af Amer: >60 mL/min (ref >60)
GFR calc Af Amer: >60 mL/min (ref >60)
GFR calc non Af Amer: >60 mL/min (ref >60)
GFR calc non Af Amer: >60 mL/min (ref >60)

## 2010-06-13 LAB — MAGNESIUM
Magnesium: 2.4 mg/dL (ref 1.5–2.5)
Magnesium: 2.4 mg/dL (ref 1.5–2.5)
Magnesium: 3.4 mg/dL — ABNORMAL HIGH (ref 1.5–2.5)

## 2010-06-13 LAB — HEPARIN LEVEL (UNFRACTIONATED)
Heparin Unfractionated: 0.12 IU/mL — ABNORMAL LOW (ref 0.30–0.70)
Heparin Unfractionated: 0.32 IU/mL (ref 0.30–0.70)
Heparin Unfractionated: 0.38 IU/mL (ref 0.30–0.70)
Heparin Unfractionated: 0.42 IU/mL (ref 0.30–0.70)
Heparin Unfractionated: 0.5 IU/mL (ref 0.30–0.70)
Heparin Unfractionated: 0.51 IU/mL (ref 0.30–0.70)
Heparin Unfractionated: 0.6 IU/mL (ref 0.30–0.70)
Heparin Unfractionated: 0.83 IU/mL — ABNORMAL HIGH (ref 0.30–0.70)

## 2010-06-13 LAB — CK TOTAL AND CKMB (NOT AT ARMC)
CK, MB: 2.7 ng/mL (ref 0.3–4.0)
Relative Index: INVALID (ref 0.0–2.5)
Total CK: 94 U/L (ref 7–177)

## 2010-06-13 LAB — ABO/RH: ABO/RH(D): O POS

## 2010-06-13 LAB — LIPID PANEL: Triglycerides: 194 mg/dL — ABNORMAL HIGH (ref <150)

## 2010-06-13 LAB — POCT CARDIAC MARKERS: Troponin i, poc: <0.05 ng/mL (ref 0.00–0.09)

## 2010-06-13 LAB — PREPARE RBC (CROSSMATCH)

## 2010-06-13 LAB — PLATELET INHIBITION P2Y12
Platelet Function  P2Y12: 243 [PRU] (ref 194–418)
Platelet Function  P2Y12: 349 [PRU] (ref 194–418)
Platelet Function Baseline: 350 [PRU] (ref 194–418)
Platelet Function Baseline: 340 [PRU] (ref 194–418)

## 2010-06-13 LAB — PROTIME-INR
INR: 1.22 (ref 0.00–1.49)
INR: 1.05 (ref 0.00–1.49)
Prothrombin Time: 15.3 seconds — ABNORMAL HIGH (ref 11.6–15.2)

## 2010-06-13 LAB — POCT I-STAT GLUCOSE: Operator id: 141321

## 2010-06-13 LAB — APTT: aPTT: 34 seconds (ref 24–37)

## 2010-06-13 LAB — HEMOGLOBIN A1C
Hgb A1c MFr Bld: 5.9 % — ABNORMAL HIGH (ref <5.7)
Mean Plasma Glucose: 123 mg/dL — ABNORMAL HIGH (ref <117)

## 2010-06-13 LAB — MRSA PCR SCREENING: MRSA by PCR: NEGATIVE

## 2010-06-30 ENCOUNTER — Encounter: Payer: Self-pay | Admitting: *Deleted

## 2010-07-01 ENCOUNTER — Ambulatory Visit: Payer: Self-pay | Admitting: Cardiology

## 2010-07-01 ENCOUNTER — Encounter: Payer: Self-pay | Admitting: Cardiology

## 2010-07-08 ENCOUNTER — Other Ambulatory Visit: Payer: Self-pay | Admitting: *Deleted

## 2010-07-08 ENCOUNTER — Encounter: Payer: Self-pay | Admitting: *Deleted

## 2010-07-08 DIAGNOSIS — Z79899 Other long term (current) drug therapy: Secondary | ICD-10-CM

## 2010-07-08 DIAGNOSIS — E785 Hyperlipidemia, unspecified: Secondary | ICD-10-CM

## 2010-07-15 ENCOUNTER — Telehealth: Payer: Self-pay | Admitting: *Deleted

## 2010-07-15 NOTE — Telephone Encounter (Signed)
Pt's line busy. Unable to leave message.

## 2010-07-15 NOTE — Telephone Encounter (Signed)
Message copied by Cyril Loosen on Thu Jul 15, 2010  4:27 PM ------      Message from: Nona Dell      Created: Wed Jul 14, 2010  4:47 PM       Triglycerides and total cholesterol are quite high. Is she still taking Crestor, as noted in her last office visit with Dr. [name] in November?

## 2010-07-20 NOTE — Telephone Encounter (Signed)
Attempted to reach pt regarding lab results. No answer/no voicemail.

## 2010-07-30 NOTE — Telephone Encounter (Signed)
Pt states she is no longer taking Crestor. She states it was making her hurt so she stopped taking it. She has not tried any other cholesterol medications that she is aware of. Pt has an appt on 5/15. She will discuss further w/Dr. Diona Browner at this time.

## 2010-08-10 ENCOUNTER — Ambulatory Visit: Payer: Self-pay | Admitting: Cardiology

## 2010-08-10 NOTE — Assessment & Plan Note (Signed)
OFFICE VISIT   Latoya Crane, Latoya Crane  DOB:  [birth year]                                        November 05, 2009  CHART #:  [id number]   The patient returns to the office today in followup after coronary  artery bypass grafting done September 25, 2009.  Since that time she has been  at a nursing home in [place].  It is unclear to me why she is still there.  Her daughter notes that is there because she might have a panic attack  when she goes home.  She has noted some issues with control of her  Coumadin and had bleeding from her left ear and was referred to ENT.  I  do not have records of her current Coumadin dose nor what her INRs are  currently.  This is monitored by Dr. Diona Browner and Dr. [name].  Overall,  she appears to be making good progress following her surgery.  She notes  she had been walking, but when INR got high the nursing home refused to  let her get out of bed.   She comes into the office today walking without difficulty.   On exam her blood pressure is 134/74, pulse 64, respiratory rate is 18,  O2 saturations 98%.  Her lungs are clear.  Sternum is stable and well  healed.  She has no pedal edema.  There is no obvious excessive bruising  or bleeding from ear today.   She continues on a long list of medications, which is reviewed though  that sheet from the nursing home did not accompany her today.  She notes  that she is still with Coumadin taken 325 mg of aspirin.  If she is  having trouble with controlling her INR and bleeding, this could be  reduced at 81 mg, but I will leave this up to the discretion of Dr.  Diona Browner and Dr. [name].   Overall, she appears to be doing well postoperatively and I see no  reason from a surgical point of view she could not be at home.  I have  not made her return appointment to see me but would be glad to see her  at Dr. Ival Bible request at anytime.   Sheliah Plane, MD  Electronically Signed   EG/MEDQ  D:   11/05/2009  T:  11/05/2009  Job:  [id number]   cc:   Lyndle Herrlich, MD

## 2010-08-10 NOTE — Assessment & Plan Note (Signed)
[facility]                         ELECTROPHYSIOLOGY OFFICE NOTE   VINISHA, FAXON                 MRN:          [id number]  DATE:04/23/2008                            DOB:          05-01-1924    [name] returns today for followup.  She is a very pleasant  elderly woman with a history of symptomatic tachy-brady syndrome and  paroxysmal AFib who underwent permanent pacemaker insertion several  months ago.  She returns today for followup.  She has two-vessel  coronary disease.  She has preserved LV function.  The patient denies  chest pain or shortness of breath.  She notes that she has been somewhat  depressed.  She has difficulties at home according to the patient and  her daughter who is with her today.  Apparently, the patient's husband  has some medical problems and some personality problems that have been  quite stressful to the patient.  She denies, however, any abusive  behavior.  Her current medications include gemfibrozil 600 twice a day,  diltiazem 120 twice a day, warfarin as directed, amiodarone 200 a day,  HCTZ 25 a day, Synthroid 50 mcg daily.   PHYSICAL EXAMINATION:  GENERAL:  She is a pleasant elderly woman in no  distress.  VITAL SIGNS:  Blood pressure was 112/60, the pulse 64 and regular, the  respirations were 18.  The weight was 107 pounds.  NECK:  No jugular distention.  LUNGS:  Clear  bilaterally to auscultation.  No wheezes, rales, or  rhonchi are present.  There is no increased work of breathing.  CARDIOVASCULAR:  Regular rate and rhythm.  Normal S1 and S2.  EXTREMITIES:  Demonstrate no edema.   Pacemaker insertion site was healed nicely.   Interrogation of pacemaker demonstrates a Engineer, structural.  P-waves were  4.  The R-waves were greater than 12.  The impedance 434 in the A and  355 in the B.  Threshold 0.75 and 0.5 both in the right atrium and the  right ventricle.  Battery voltage was 2.79 volts.   She was mode switched  to 135 times.  She was 83% A paced.  Today, her outputs were turned down  to 2 in the A and 2-1/2 in the RV.   IMPRESSION:  1. Symptomatic bradycardia.  2. Paroxysmal atrial fibrillation.  3. Status post pacemaker insertion.   DISCUSSION:  Latoya Crane is stable.  Her pacemaker is working  normally .  The plan will be to decrease her amiodarone dose when I see  her back in the office in several months.      Latoya Crane. [name], MD  Electronically Signed    GWT/MedQ  DD: 04/23/2008  DT: 04/24/2008  Job #: [id number]   cc:   Selinda Flavin

## 2010-08-10 NOTE — Discharge Summary (Signed)
Latoya Crane, Crane NO.:  [id number]   MEDICAL RECORD NO.:  [id number]          PATIENT TYPE:  INP   LOCATION:  3733                         FACILITY:  [facility]   PHYSICIAN:  Doylene Canning. [name], MD    DATE OF BIRTH:  25-Mar-1925   DATE OF ADMISSION:  01/16/2008  DATE OF DISCHARGE:  01/23/2008                               DISCHARGE SUMMARY   FINAL DIAGNOSES:  1. Ad[place] 19 at [facility] [place] with      chest discomfort radiating to the jaw.  2. Findings.      a.     Atrial fibrillation rapid ventricular response/chest pain       stopped with rate control.      b.     Increased troponin I studies secondary to demand ischemia -       type II NSTEMI (non-ST segment elevation myocardial infarction).  3. Left heart catheterization October 21 ejection fraction 75-80%.      The mid right coronary artery had 80% stenosis, the LAD had a 90%      stenosis at the takeoff of the diagonal.  This was a high-risk LAD      (left anterior descending) lesion for PCI (percutaneous coronary      intervention).  Medical therapy has been pursued this admission.      The patient has had no recurrence of chest pain.  4. Pacemaker implanted January 17, 2008, for tachybrady syndrome (a      St. Jude Zephyr dual chamber pacemaker).  5. Atrial fibrillation with difficult rate control.  The patient has      been placed on amiodarone 200 mg daily and Cardizem 240 mg daily.  6. Debilitation.  The patient has had a slow recovery but is getting      stronger.   PAST MEDICAL HISTORY:  1. Hypertension.  2. Dyslipidemia.  3. Treated hypothyroidism.  4. History of TIAs.  5. Paroxysmal atrial fibrillation.  6. Osteoarthritis.  7. Anxiety.  8. Status post total abdominal hysterectomy.  9. Benign breast biopsy in the past.  10.Bilateral cataract surgeries.   PROCEDURES:  1. January 16, 2008, by Dr. Tonny Bollman.  The procedure showed that      the left  ventricular ejection fraction was hyperdynamic with      ejection fraction 75-80%.  The RCA was nondominant with 80%      midpoint stenosis.  The LAD had a 90% stenosis at the takeoff of      the diagonal.  This would be difficult for PCI.  The patient has      been treated with medical therapy this admission.  2. January 17, 2008, implant of a St. Jude Zephyr dual chamber      pacemaker for tachybrady syndrome, Dr. Lewayne Bunting.   BRIEF HISTORY:  [name] is an 77 year old female.  She has a  past medical history of hypertension, dyslipidemia, prior transient  ischemic attacks.  She presented to [facility] with irregular  heartbeat.  This was also associated with chest discomfort and it has  radiated to the jaw.  These episodes are related to irregular heartbeat  with palpitation.   She underwent an ischemic evaluation with adenosine Cardiolite September  2008.  This showed ejection fraction 70%, no significant perfusion  defects.   The patient has spontaneously converted to sinus bradycardia with some  brief recurrence to atrial fibrillation with rapid ventricular rates to  120s.  She has fairly significant inferolateral ST-T wave changes and  these are noted particularly in the setting of atrial fibrillation with  rapid ventricular rate.  Her cardiac markers are minimally elevated.  Troponin I study is 0.15.  The patient has been dodging the question of  Coumadin therapy for atrial fibrillation for quite a while during this  hospitalization.  We will address it once again.  We will transfer the  patient to [facility] for catheterization.   HOSPITAL COURSE:  The patient admitted to [facility] October 21  en transfer from [facility].  She underwent left heart  catheterization the same day.  This study did show a high-grade stenosis  in the LAD at a takeoff of the diagonal which would be difficult for  percutaneous intervention.  The patient  has been treated aggressively  with medical therapy.  The patient is also very symptomatic and has a  type II NSTEMI equivalent with atrial fibrillation, rapid ventricular  rates and so medication for this is of the utmost importance.  The  patient, because of this, underwent pacemaker implantation on October 22  by Dr. [name].  A St. Jude dual chamber Zephyr pacemaker was implanted  and thereafter attention was turned to controlling her atrial  fibrillation.  This has proved successful with the addition of diltiazem  120 mg twice daily and an amiodarone load.  The patient has been AV  pacing for the last 72 hours with no recurrence of atrial fibrillation.  She does have some brief runs of NSVT but these are also moderated as  the amiodarone has been loaded.  The patient's hospitalization has been  prolonged since the patient has been hypotensive several days preventing  active engagement with her medications.  She has also been debilitated  and it is recommended that a short duration of stay at a nursing  facility be undertaken.  The patient, however, just prior to discharge  in the last 36 hours as shown the ability to rebound quite significantly  in her exercise tolerance.  The patient has been started on Coumadin  here and discharges with an INR of 2.  She is on 2 mg daily.  This will  be continued as an outpatient.  Her medications at discharge are as  follows:   DISCHARGE MEDICATIONS:  1. Crestor 5 mg daily at bedtime (a new medication).  2. Hydrochlorothiazide 12.5 mg daily (a new dose down from 25 mg      daily).  3. Amiodarone 200 mg daily (new medication).  4. Diltiazem 120 mg 1 tablet in the morning and 1 tablet in the      evening (new medication).  5. MiraLax 17 grams in 8 ounces of juice or water daily (new      medication).  6. Coumadin 2 mg daily or as directed by [facility]      office (new medication).  7. Lopid 600 mg twice daily.  8. Synthroid 50 mcg  daily.  9. Omega 3 fish oil 2000 mg twice daily.  10.Pyridium 100 mg three times daily for urinary discomfort.  11.Enteric coated aspirin  81 mg daily.  12.The patient is asked to stop Plavix.  13.She is to stop Toprol.   INSTRUCTIONS:  Have been given to the patient as to how to move her left  arm.  She will begin to move her arm freely by Thursday, October 29.  She is asked to keep her incision dry for the next week.  She may begin  showering also on Thursday, October 29.  Home health or the staff at  [facility] will draw a pro-time Friday, October 30 and the results are  faxed to the [facility] office.  The fax number is  [phone].  She also has followup for her pacemaker.  This will be at  [facility] [address], [place] office, pacer  clinic Wednesday, November 11 at 9:20 in the morning.  She follows up  with [facility] office to see Dr. Diona Browner, Monday,  November 23 at 1:30.  She follows up at [facility]  [address] to see Dr. [name] Wednesday, January 27 at  11:30 for reinterrogation of her pacemaker.   LABORATORY STUDIES:  This admission on the day of discharge a complete  blood count hemoglobin 11.6, hematocrit 34.4, white cells 9.9, platelets  of 389.  Serum electrolytes on the day of discharge - sodium 133,  potassium 3.7, chloride 97, carbonate 28, BUN 17, creatinine 0.99 and  glucose is 100.  Pro-time on the day of discharge 23.8.  INR 2.0,  alkaline phosphatase this admission 52, SGOT 19, SGPT is 10.  T3 this  admission is 64.7 slightly low, T4 8.2 within normal limits.  TSH is  0.285.      Maple Mirza, PA      Doylene Canning. [name], MD  Electronically Signed    GM/MEDQ  D:  01/23/2008  T:  01/23/2008  Job:  [id number]   cc:   Learta Codding, MD,FACC  [facility]  Floor [phone] at [facility]

## 2010-08-10 NOTE — Assessment & Plan Note (Signed)
[facility]                          [facility] NOTE   AALEAH, HIRSCH                 MRN:          [id number]  DATE:02/18/2008                            DOB:          [birth year]    PRIMARY CARE PHYSICIAN:  Dr. Selinda Flavin.   REASON FOR VISIT:  Post-hospital followup.   HISTORY OF PRESENT ILLNESS:  I saw Ms. Alverio as an inpatient  consult at [facility] in late October 2009.  She was  diagnosed at that time with rapid paroxysmal atrial fibrillation  associated with minor troponin-I level abnormalities, technically a type  2 non-ST-elevation myocardial infarction.  She was noted to have a  CHADS2 score 4 at that time and had not yet initiated Coumadin.  We  discussed the matter, and I recommended some initial medication  adjustments with a baseline echocardiogram and further followup from  there.  She was seen subsequently by Dr. Andee Lineman and arrangements were  made to transfer her to [place] in anticipation of a  diagnostic cardiac catheterization and further evaluation by the  Electrophysiology Service.  She underwent cardiac catheterization by Dr.  Excell Seltzer on January 16, 2008, and this study revealed essentially 2-vessel  obstructive coronary artery disease with severe mid left anterior  descending stenosis described as a long 50% stenosis proximally followed  in the midportion of the vessel by a focal 90% stenosis just beyond a  large second diagonal.  The circumflex was relatively normal and the  right coronary artery exhibited a long segmental area of 70-80%  stenosis.  This was reviewed from the perspective of revascularization  and it was felt that the left anterior descending lesion in particular  was fairly high risk in that it was in an area of extreme tortuosity and  involved a relatively large bifurcating diagonal branch.  It was also  considered that the patient would need  long-term Coumadin and avoiding  Plavix was felt to be the best option at that time with medical therapy  for her cardiovascular disease rather than pursuing stent placement.  Echocardiography demonstrated overall normal left ventricular systolic  function, and she was subsequently initiated on Coumadin and Amiodarone.  She had also demonstrated findings of tachycardia-bradycardia syndrome,  ultimately requiring placement of a St. Jude dual-chamber pacemaker by  Dr. [name].  Medical therapy was adjusted, and she was ultimately  discharged to the [facility] for further rehabilitation.  She has been  back home since February 12, 2008.  She has not seen Dr. [name] as yet.  I do see a followup INR level on February 12, 2008 at 1.5.  She is not  entirely certain about her medications, although the list below seems to  be what she is taking.  She does report having 2 daughters locally that  look in on her, in fact one lives with her.  She is denying any active  angina or palpitations at this time.  Her followup electrocardiogram  today without magnet shows an atrial paced rhythm at 60 beats per  minute, with magnet shows AV sequential pacing at 98 beats per minute.  She denies any active bleeding problems.   ALLERGIES:  No known drug allergies.   PRESENT MEDICATIONS:  1. Coumadin to achieve an INR of 2.0-3.0.  2. Diltiazem CD 120 mg p.o. b.i.d.  3. Gemfibrozil 600 mg p.o. b.i.d.  4. Hydrochlorothiazide 12.5 mg p.o. daily.  5. Amiodarone 200 mg p.o. daily.  6. Levothyroxine 50 mcg p.o. daily.  7. Pyridium 100 mg p.o. t.i.d.  8. Crestor 5 mg p.o. daily.  9. She has lorazepam 1 mg p.o. p.r.n. at home as well.   REVIEW OF SYSTEMS:  As described in the history of present illness.  Otherwise, negative.  She reports functioning reasonably well with her  activities of daily living.  She reports feeling well with the physical  therapy she had at the [facility].   PHYSICAL EXAMINATION:   VITAL SIGNS:  Blood pressure is 135/60, heart  rate is 16 and regular, and weight is 109.6 pounds.  GENERAL:  The patient is comfortable and in no acute distress.  HEENT:  Conjunctivae is normal.  Oropharynx is clear.  NECK:  Supple.  No elevated jugular venous pressure.  No loud bruits.  No thyromegaly.  LUNGS:  Clear without labored breathing at rest.  Pacemaker site in the  left upper thorax is overall stable.  No purulence or erythema noted.  Incision site is well healed.  No obvious fluctuance or unusual  ecchymosis.  CARDIAC:  Regular rate and rhythm.  No pericardial rub or S3 gallop.  ABDOMEN:  Soft and nontender.  Normoactive bowel sounds.  EXTREMITIES:  Exhibit no frank pitting edema.  Distal pulses are 1-2+.  SKIN:  Warm and dry.  MUSCULOSKELETAL:  No kyphosis noted.  NEUROPSYCHIATRIC:  The patient is alert and oriented x3.  Affect is  appropriate.   IMPRESSION AND RECOMMENDATIONS:  1. Paroxysmal atrial fibrillation associated with symptomatic rapid      ventricular response and also tachycardia-bradycardia syndrome.      CHADS2 score is 4.  Ms. Standlee is now on Coumadin long term,      and we will establish followup through our Coumadin Clinic.  She is      also status post dual-chamber pacemaker placement by Dr. [name] and      will be enrolled in our Device Clinic as well.  Her device pocket      site looks to be healing well, and she will be due for device      evaluation over the next few months.  She denies any sense of      palpitations recently and is on the medical regimen as outlined      above including Cardizem CD and amiodarone.  I will plan a followup      with her over the next month.  2. 2-vessel obstructive coronary artery disease involving the left      anterior descending and right coronary artery and associated with      normal ventricular systolic function.  Percutaneous intervention      was felt to be relatively high risk given the degree of  tortuosity      within the left anterior descending and also a need for likely long-      term Plavix and aspirin along with Coumadin increasing the      patient's bleeding risk.  We are managing her medically at this      point and fortunately, she is not complaining of any significant      angina.  I  have asked her to start taking an 81 mg aspirin along      with her Coumadin daily.  3. Hyperlipidemia with LDL cholesterol of 203 based on labs from      [facility] in October 2009.  I see a mention of      statin intolerance (specifically Zocor) in Dr. Jeannette How records.      She was placed on low-dose Crestor, and we will see how she      tolerates      this.  If she is able to remain on this medication, she will need a      followup lipid profile and liver function tests around the time of      her next visit.  She is also on gemfibrozil.     Jonelle Sidle, MD  Electronically Signed    SGM/MedQ  DD: 02/18/2008  DT: 02/19/2008  Job #: [id number]   cc:   Selinda Flavin

## 2010-08-10 NOTE — Assessment & Plan Note (Signed)
[facility]                          [facility]ICE NOTE   TONIKA, EDEN                 MRN:          [id number]  DATE:08/18/2008                            DOB:          April 14, 1924    PRIMARY CARE PHYSICIAN:  Dr. Selinda Flavin.   REASON FOR VISIT:  Scheduled followup.   HISTORY OF PRESENT ILLNESS:  I saw Ms. Bitter back in late  November 2009.  Her history is well detailed in the previous note.  She  reports no problems with palpitations or chest pain.  She has had  significant STATIN intolerances, now to ZOCOR, LIPITOR, and CRESTOR  taken on a daily basis.  She describes arthralgias on these medications.  She has been under lot of stress reporting the death of her  granddaughter within the last month and also pending gallbladder surgery  in her husband.  She reports compliance with her medications which are  outlined below.  Dr. [name] has been following Coumadin most recently.  Today, we talked about her cholesterol numbers including a total  cholesterol of 323, LDL of 249, and HDL of 51 as of mid April.  She does  state that she has been trying to limit saturated fat intake in her  diet.   ALLERGIES:  No known drug allergies.   MEDICATIONS:  1. Coumadin as direct by Dr. [name] to achieve a therapeutic INR of      2.0-3.0  2. Diltiazem CD 120 mg p.o. b.i.d.  3. Gemfibrozil 600 mg p.o. b.i.d.  4. Hydrochlorothiazide 25 mg p.o. daily.  5. Aspirin 81 mg p.o. daily.  6. Levothyroxine 0.05 mg p.o. daily.  7. Lorazepam 0.5 mg p.o. p.r.n.   Review of systems is outlined above.  She does have some bruising  occasionally, some darkening discoloration of her distal extremities.  No rapid palpitations or syncope.  No obvious falls.  No typical  exertional chest pain.  Otherwise, reviewed and negative.   PHYSICAL EXAMINATION:  VITAL SIGNS:  Blood pressure 170/76, heart rate  is 60 and regular, weight is 104 pounds which is  down 5 pounds from her  last visit.  GENERAL:  Elderly woman in no acute distress.  HEENT:  Conjunctiva is normal.  Oropharynx clear.  NECK:  Supple.  No elevated jugular venous pressure.  No thyromegaly.  LUNGS:  Clear.  Nonlabored breathing.  Examination of the thorax shows  stable pacemaker pocket site in the left upper thorax.  CARDIAC:  A regular rate and rhythm.  No pericardial rub or S3 gallop.  ABDOMEN:  Soft, nontender.  Bowel sounds present.  EXTREMITIES:  No frank pitting edema.  There is some venous  discoloration/stasis.  Distal pulses 1+.  SKIN:  Warm and dry.  MUSCULOSKELETAL:  Mild kyphosis noted.  NEUROPSYCHIATRIC:  The patient is alert and oriented x3.  Affect is  appropriate.   IMPRESSION:  1. Paroxysmal atrial fibrillation with history of tachycardia-      bradycardia syndrome and a CHADS2 score of 4, status post dual-      chamber pacemaker placement, and on chronic Coumadin therapy.  She  has had no significant issues with palpitations or dizziness on the      present dose of Cardizem CD.  No specific changes will be made      today.  2. Two-vessel obstructive cardiovascular disease involving the left      anterior descending and right coronary artery with normal left      ventricular systolic function.  Symptomatically, Ms. Nudd      is stable without progressive angina.  She was felt to have a      fairly high risk for percutaneous intervention.  We will plan to      continue medical therapy at this point.  3. Poorly-controlled hyperlipidemia.  Statin intolerance complicates      this.  I have asked Ms. Bayles to try Crestor 5 mg every      other day.  If she tolerates this, we may be able to gingerly      advance this overtime.  Followup liver profile or liver function      tests will be obtained in 8 weeks assuming she is able to stay on      the Crestor at that dose.  She will otherwise continue Lopid for      the time being.   Triglycerides were normal.     Jonelle Sidle, MD  Electronically Signed    SGM/MedQ  DD: 08/18/2008  DT: 08/19/2008  Job #: [id number]   cc:   Selinda Flavin

## 2010-08-10 NOTE — Assessment & Plan Note (Signed)
[facility]                          [facility]ICE NOTE   Latoya Crane, SENSABAUGH                 MRN:          [id number]  DATE:03/24/2008                            DOB:          Jan 01, 1925    PRIMARY CARE PHYSICIAN:  Dr. Selinda Flavin.   REASON FOR VISIT:  Scheduled followup.   HISTORY OF PRESENT ILLNESS:  I saw Latoya Crane near the end of  November.  Her history is well detailed in the previous note.  She  states that generally she is doing very well.  She reports that her  memory has been fading somewhat, although she has not had any  significant angina or palpitations.  She continues on the medications  outlined below.  I notice the absence of Crestor and Latoya Crane  states that she did not tolerate this reporting problems with myalgias  predominately affecting her left leg.  She had some followup blood work  done by Dr. [name], although is not due to see him back until next  month.  Today, I reviewed her medications and we discussed trying to  obtain more aggressive LDL control in light of her cardiovascular  disease.  She has now not been able to tolerate either Zocor or Crestor.  She had an electrocardiogram done in late November showing an atrial  paced rhythm.  Her device pocket site has healed quite well.  She  continues on amiodarone and in fact needs a refill.   ALLERGIES:  No known drug allergies.   MEDICATIONS:  1. Coumadin as followed by Dr. [name] at this point.  2. Diltiazem CD 120 mg p.o. b.i.d.  3. Gemfibrozil 600 mg p.o. b.i.d.  4. Amiodarone 200 mg p.o. daily.  5. Levothyroxine 50 mcg p.o. daily.  6. Hydrochlorothiazide 12.5 mg p.o. daily.  7. Lorazepam 1 mg p.o. p.r.n.   REVIEW OF SYSTEMS:  As outlined above.  No obvious bleeding problems.  No palpitations or syncope.  No angina.  Otherwise negative.   PHYSICAL EXAMINATION:  VITAL SIGNS:  Blood pressure is 150/69, heart  rate is 59 and regular,  weight is 108 pounds.  The patient is  comfortable in no acute distress.  NECK:  No elevated jugular venous pressure, no loud bruits.  No  thyromegaly is noted.  LUNGS:  Clear without labored breathing at rest.  CHEST:  Well-healed left upper thoracic pacemaker site.  CARDIAC:  Regular rate and rhythm.  No pericardial rub or S3 gallop.  EXTREMITIES:  Exhibit no frank pitting edema.  Distal pulses are 1+.  SKIN:  Warm and dry.  MUSCULOSKELETAL:  No kyphosis noted.  NEUROPSYCHIATRIC:  The patient is alert and oriented x3, affect is  appropriate.   IMPRESSION AND RECOMMENDATIONS:  1. Paroxysmal atrial fibrillation with tachycardia/bradycardia      syndrome status post dual-chamber pacemaker placement by Dr.      [name].  Latoya Crane continues on Coumadin under the direction      of Dr. [name] (goal INR of 2.0-3.0) and also amiodarone at a      relatively low dose.  She is not troubled with any significant  palpitations at this point and has a regular rate on examination      today with an atrial paced rhythm recently documented.  We will      continue her present course and I will see her back over the next 3      months.  She will need to follow up liver function and thyroid      studies as well as an ECG around the time of her next visit.  2. Two-vessel obstructive coronary artery disease involving the left      anterior descending and right coronary artery associated with left      ventricular systolic function and in the absence of any active      angina.  I asked Latoya Crane to take a coated baby aspirin      along with her present medications.  3. History of hyperlipidemia with LDL cholesterol around 200 based on      prior evaluation.  Latoya Crane has not tolerated either Zocor      or Crestor due to myalgias.  I would like to try Lipitor 10 mg      daily.  We discussed the rationale for more aggressive cholesterol      control in her case with obstructive  coronary artery disease that      is being managed medically at this point.  She was in agreement to      try.     Jonelle Sidle, MD  Electronically Signed    SGM/MedQ  DD: 03/24/2008  DT: 03/25/2008  Job #: [id number]   cc:   Selinda Flavin

## 2010-08-10 NOTE — Op Note (Signed)
NAMEERON, GOBLE NO.:  [id number]   MEDICAL RECORD NO.:  [id number]          PATIENT TYPE:  INP   LOCATION:  2926                         FACILITY:  [facility]   PHYSICIAN:  Doylene Canning. [name], MD    DATE OF BIRTH:  [birth year]   DATE OF PROCEDURE:  01/17/2008  DATE OF DISCHARGE:                               OPERATIVE REPORT   PROCEDURE PERFORMED:  Implantation of a permanent pacemaker.   INDICATIONS:  Symptomatic tachy-brady syndrome with pauses and sinus  brady in the 30s as well as AFib with rapid ventricular response in the  120-130 range.   PROCEDURE IN DETAIL:  After informed consent was obtained, the patient  was taken to the diagnostic EP lab in a fasting state.  After usual  preparation and draping, intravenous fentanyl and midazolam was given  for sedation.  Lidocaine 30 mL was infiltrated in left infraclavicular  region.  A 5-cm incision was carried over this area and electrocautery  was utilized to dissect down to the fascial plane.  The left subclavian  vein was punctured x2 and the St. Jude model 1788T, 52-cm active  fixation pacing lead, serial #[id number] was advanced into the right  ventricle.  The St. Jude model [phone], 46-cm active fixation pacing lead,  serial #[id number] was advanced into the right atrium.  Mapping was  carried out in the right ventricle on the final site.  R-waves were 25  mV, the pacing threshold was 0.9 volts at 0.5 milliseconds, and the  impedance 809 ohms with the lead actively fixed.  A 10-volt pacing did  not stimulate the diaphragm.  With the ventricular lead in satisfactory  position, attention was then turned to placement of the atrial lead.  It  was placed in the anterolateral portion of the right atrium where  fibrillation waves measured between 1-2 mV and the pacing impedance in  the AOO mode was 783 ohms with lead actively fixed.  With these  satisfactory parameters, the lead was secured to subpectoralis fascia  with  a figure-of-eight silk suture.  The sewing sleeve was also secured  with silk suture.  Electrocautery was then utilized to make a  subcutaneous pocket.  Kanamycin irrigation was utilized to irrigate the  pocket.  Electrocautery was utilized to assure hemostasis.  The St. Jude  Zephyr XL DR dual-chamber pacemaker, serial [id number] was connected to  the atrial and ventricular leads and placed back in the subcutaneous  pocket.  Generator was secured with silk suture.  Additional gentamicin  was utilized to irrigate the pocket.  The incision closed with 2-0  Vicryl followed by a layer of 3-0 Vicryl.  Benzoin was painted on the  skin.  Steri-Strips were applied, a pressure dressing was placed, and  the patient was returned to her room in satisfactory condition.   COMPLICATIONS:  There were no immediate procedural complications.   RESULTS:  This demonstrates a successful implantation of a St. Jude dual-  chamber pacemaker in a patient with symptomatic tachy-brady syndrome.      Doylene Canning. [name], MD  Electronically Signed     GWT/MEDQ  D:  01/17/2008  T:  01/17/2008  Job:  [id number]   cc:   Learta Codding, MD,FACC  Selinda Flavin

## 2010-08-10 NOTE — Assessment & Plan Note (Signed)
OFFICE VISIT   Latoya, Crane  DOB:  1924-05-18                                        October 15, 2009  CHART #:  [id number]   HISTORY:  The patient returns to the office today in followup after her  recent hospitalization.  She is an 77 year old female who had a  complicated hospital course starting with a proximally placed bare-metal  stent in her LAD and subsequently returned several days later with  profound chest pain from distal LAD disease and right coronary disease.  She underwent off-pump coronary artery bypass grafting x2 with left  internal mammary to the LAD and reverse saphenous vein graft to the  right coronary artery.  She was also found in this process to be a non-  responder to Plavix.  The ___________ testing and for this reason, was  ultimately discharged home on increased dose of Plavix.  Because of her  fertility, she was initially discharged home to [facility] in  [place].  At this point, the patient is ambulating well and insists  that she is going to go home from the nursing home tomorrow.  I see no  reason why she cannot do this.  It appears from the nursing records at  the nursing home that she was to start Coumadin on October 13, 2009, and  then discontinue her Plavix 150 mg a day on October 16, 2009, as outlined  by Dr. Ival Bible office.  Overall, the patient, although she has some  soreness and poor appetite, she looks in very good shape and is able to  ambulate around the office without difficulty.   PHYSICAL EXAMINATION:  Her blood pressure 147/73, pulse is 70,  respiratory rate is 18, and O2 sats 98%.  Her sternum is stable and well  healed.  Her lungs are clear bilaterally.  She has no pedal edema.  No  calf tenderness.  The endovein harvest site is healing well.   MEDICATIONS:  Her medication list include:  1. Levothyroxine 50 mcg a day.  2. Potassium 10 mEq a day.  3. Crestor 10 mg a day.  4. Folic acid a  milligram a day.  5. Lopressor 12.5 b.i.d.  6. Nu-Iron 150 a day.  7. Protonix 40 a day.  8. Plavix 150 mg daily, to stop tomorrow.  9. Cardizem CD 120 mg a day.  10.Aspirin 325 a day.  11.Hydrochlorothiazide 25 a day.  12.Coumadin was started October 13, 2009, the dose is not clear.   DIAGNOSTIC TESTS:  Followup chest x-ray shows slight blunting of the  left costophrenic angle.  Otherwise, clear lung fields.   IMPRESSION:  At this point, the patient is doing extremely well  especially considering her age of 67 years.  She would like to discharge  herself from the nursing home on October 16, 2009.  I see no reason she  cannot do this, but did emphasize to her to make sure her medication  list is well coordinated between the nursing home and Dr. Ival Bible  office especially with the transition from Plavix to Coumadin that is  currently being directed by his office.  I plan to see her back in 3-4  weeks, and I have encouraged her to increase her ambulation to at least  3 times a day.   Sheliah Plane, MD  Electronically Signed   EG/MEDQ  D:  10/15/2009  T:  10/16/2009  Job:  [id number]

## 2010-08-10 NOTE — Cardiovascular Report (Signed)
NAMEMORIAH, SHAWLEY NO.:  [id number]   MEDICAL RECORD NO.:  [id number]          PATIENT TYPE:  INP   LOCATION:  2926                         FACILITY:  [facility]   PHYSICIAN:  Veverly Fells. Excell Seltzer, MD  DATE OF BIRTH:  02-27-1925   DATE OF PROCEDURE:  01/16/2008  DATE OF DISCHARGE:                            CARDIAC CATHETERIZATION   PROCEDURE:  Left heart catheterization, selective coronary angiography,  and left ventricular angiography.   INDICATIONS:  Ms. Amy is an 77 year old woman who presented  with atrial fib with RVR.  She had a type 2 myocardial infarction with a  small increase in troponin and also an increase in CK-MB.  She has had  tachybrady syndrome and was transferred here for permanent pacemaker.  We elected to proceed with cardiac catheterization prior to pacemaker  placement to rule out significant obstructive CAD.   The patient has had no angina with the exception of when she has rapid  heart rates with her atrial fib.   Risks and indications of the procedure were reviewed with the patient.  Informed consent was obtained.  The right groin was prepped, draped, and  anesthetized with 1% lidocaine.  Using modified Seldinger technique, a 5-  French sheath was placed in the right femoral artery.  Standard 5-French  Judkins catheters were used for coronary angiography and left  ventriculography.  All catheter exchange were performed over guidewire.  The patient tolerated procedure well.  There were no immediate  complications.   FINDINGS:  Aortic pressure 122/53 with a mean of 80, left ventricular  pressure 120/19.   Left mainstem:  Widely patent.  Bifurcates into LAD and left circumflex.   LAD:  The LAD is extremely tortuous.  It has the typical appearance of  the patient with severe hypertensive heart disease with marked vessel  tortuosity.  Proximally, there is a long 50% stenosis that ends at the  first septal perforator.  The  midportion of the vessel just beyond a  large second diagonal branch has a focal 90% stenosis.  This originates  just at the diagonal branch, which supplies two large branch vessels.  It also occurs in an area of extreme tortuosity.  The remaining portion  of the vessel have no significant obstructive disease, but do exhibit  diffuse plaque.   Left circumflex:  The left circumflex is widely patent.  It is a  dominant vessel.  There are two proximal OM branches, both of which are  moderate to large in size.  The AV groove circumflex courses down and  supplies a left PDA and left posterolateral branch.  There is minor  nonobstructive plaque in the second OM branch.  Otherwise, left  circumflex has no significant angiographic stenosis.   Right coronary artery:  The RCA is nondominant.  There is moderate  diffuse stenosis throughout the mid vessel with a long segment of 70%-  80% disease.  The vessel supplies a large acute marginal branch, but  does not appear to supply branches to the inferior wall.   Left ventriculography performed in 30 degrees RAO projection  demonstrates a hyperdynamic left ventricle with an  LVEF of 75%-80%.  There is no mitral regurgitation.   ASSESSMENT:  1. Severe mid left anterior descending artery stenosis.  2. Moderate diffuse right coronary artery stenosis.  3. Normal dominant left circumflex.  4. Hypertrophic left ventricle.   PLAN:  Recommend aggressive medical therapy for Ms. Romero.  She  has a strong indication for Coumadin with a [place] score of four and  frequent paroxysmal atrial fib.  Her LAD is treatable but if it is a  high risk lesion as it is in an area of extreme tortuosity and involves  bifurcating diagonal branch.  It is also fairly far down in the vessel  and I suspect the patient will respond well to medical therapy.  I  reviewed her case with Dr. Andee Lineman and will start her on amiodarone.  She  is scheduled for permanent pacemaker  tomorrow.  We will discuss Coumadin  as the patient has been resistant to this in the past.      Casimiro Needle D. Excell Seltzer, MD  Electronically Signed     MDC/MEDQ  D:  01/16/2008  T:  01/17/2008  Job:  [id number]   cc:   Learta Codding, MD,FACC

## 2010-11-30 ENCOUNTER — Encounter: Payer: Self-pay | Admitting: *Deleted

## 2010-12-28 LAB — MAGNESIUM: Magnesium: 2.4

## 2010-12-28 LAB — HEPARIN LEVEL (UNFRACTIONATED): Heparin Unfractionated: 0.42

## 2010-12-28 LAB — BASIC METABOLIC PANEL
BUN: 19
CO2: 24
CO2: 26
CO2: 28
CO2: 28
Calcium: 9.2
Calcium: 9.3
Calcium: 9.4
Chloride: 102
Chloride: 94 — ABNORMAL LOW
Chloride: 99
Creatinine, Ser: 0.95
Creatinine, Ser: 0.99
Creatinine, Ser: 1.11
GFR calc Af Amer: 57 — ABNORMAL LOW
GFR calc Af Amer: 59 — ABNORMAL LOW
GFR calc Af Amer: >60
GFR calc Af Amer: >60
GFR calc Af Amer: >60
GFR calc non Af Amer: 54 — ABNORMAL LOW
GFR calc non Af Amer: 54 — ABNORMAL LOW
Glucose, Bld: 100 — ABNORMAL HIGH
Glucose, Bld: 113 — ABNORMAL HIGH
Potassium: 3.2 — ABNORMAL LOW
Potassium: 3.4 — ABNORMAL LOW
Potassium: 3.9
Potassium: 3.9
Sodium: 133 — ABNORMAL LOW
Sodium: 134 — ABNORMAL LOW
Sodium: 134 — ABNORMAL LOW
Sodium: 136

## 2010-12-28 LAB — CBC
HCT: 34.9 — ABNORMAL LOW
HCT: 35.9 — ABNORMAL LOW
HCT: 36
Hemoglobin: 11.4 — ABNORMAL LOW
Hemoglobin: 12.1
Hemoglobin: 12.5
MCHC: 33.5
MCHC: 33.6
MCHC: 34.2
MCHC: 34.5
MCHC: 35
MCV: 89.1
MCV: 89.4
Platelets: 270
Platelets: 274
Platelets: 281
Platelets: 326
Platelets: 329
Platelets: 347
Platelets: 389
RBC: 3.69 — ABNORMAL LOW
RBC: 3.7 — ABNORMAL LOW
RBC: 4.04
RBC: 4.15
RDW: 13.3
RDW: 13.3
WBC: 10.9 — ABNORMAL HIGH
WBC: 6.4
WBC: 7.6
WBC: 7.7
WBC: 8.2
WBC: 8.3

## 2010-12-28 LAB — COMPREHENSIVE METABOLIC PANEL
Alkaline Phosphatase: 52
BUN: 14
Calcium: 8.5
Glucose, Bld: 102 — ABNORMAL HIGH
Potassium: 3.5
Total Protein: 5.8 — ABNORMAL LOW

## 2010-12-28 LAB — PROTIME-INR
INR: 1
INR: 1.1
INR: 1.5
INR: 1.6 — ABNORMAL HIGH
INR: 1.8 — ABNORMAL HIGH
INR: 2 — ABNORMAL HIGH
INR: 2 — ABNORMAL HIGH
INR: 2.7 — ABNORMAL HIGH
Prothrombin Time: 13.5
Prothrombin Time: 14.1
Prothrombin Time: 19.1 — ABNORMAL HIGH
Prothrombin Time: 21.9 — ABNORMAL HIGH
Prothrombin Time: 23.8 — ABNORMAL HIGH
Prothrombin Time: 23.9 — ABNORMAL HIGH

## 2010-12-28 LAB — URINALYSIS, ROUTINE W REFLEX MICROSCOPIC
Glucose, UA: NEGATIVE
Leukocytes, UA: NEGATIVE
Protein, ur: NEGATIVE
Specific Gravity, Urine: 1.009
Urobilinogen, UA: 0.2

## 2010-12-28 LAB — T4: T4, Total: 8.2

## 2010-12-28 LAB — T3: T3, Total: 64.7 — ABNORMAL LOW (ref 80.0–204.0)

## 2010-12-28 LAB — URINE MICROSCOPIC-ADD ON

## 2010-12-28 LAB — APTT: aPTT: 41 — ABNORMAL HIGH

## 2011-01-25 ENCOUNTER — Encounter: Payer: Self-pay | Admitting: Internal Medicine

## 2011-02-01 ENCOUNTER — Ambulatory Visit: Payer: Self-pay | Admitting: Internal Medicine

## 2011-02-10 ENCOUNTER — Encounter: Payer: Self-pay | Admitting: Internal Medicine

## 2011-02-10 ENCOUNTER — Ambulatory Visit (INDEPENDENT_AMBULATORY_CARE_PROVIDER_SITE_OTHER): Payer: Medicare Other | Admitting: Internal Medicine

## 2011-02-10 DIAGNOSIS — I4891 Unspecified atrial fibrillation: Secondary | ICD-10-CM

## 2011-02-10 DIAGNOSIS — I495 Sick sinus syndrome: Secondary | ICD-10-CM

## 2011-02-10 DIAGNOSIS — Z950 Presence of cardiac pacemaker: Secondary | ICD-10-CM

## 2011-02-10 LAB — PACEMAKER DEVICE OBSERVATION
AL AMPLITUDE: 5 mv
AL THRESHOLD: 0.5 V
BAMS-0001: 150 {beats}/min
BAMS-0003: 70 {beats}/min
RV LEAD AMPLITUDE: 12 mv
RV LEAD THRESHOLD: 0.625 V

## 2011-02-10 NOTE — Patient Instructions (Signed)
Your physician wants you to follow-up in: 6 months with Belenda Cruise. You will receive a reminder letter in the mail one-two months in advance. If you don't receive a letter, please call our office to schedule the follow-up appointment. Your physician you to follow up in 1 year with Dr. Johney Frame. You will receive a reminder letter in the mail one-two months in advance. If you don't receive a letter, please call our office to schedule the follow-up appointment. Your physician recommends that you continue on your current medications as directed. Please refer to the Current Medication list given to you today.

## 2011-02-10 NOTE — Assessment & Plan Note (Addendum)
Stable No change required today  Continue coumadin 

## 2011-02-10 NOTE — Assessment & Plan Note (Signed)
Normal pacemaker function See Pace Art report No changes today  

## 2011-02-10 NOTE — Progress Notes (Signed)
The patient presents today for routine electrophysiology followup.  Since last being seen in our clinic, the patient reports doing very well.  She remains active despite her age. Today, she denies symptoms of palpitations, chest pain, shortness of breath, orthopnea, PND, lower extremity edema, dizziness, presyncope, syncope, or neurologic sequela.  The patient feels that she is tolerating medications without difficulties and is otherwise without complaint today.   Past Medical History  Diagnosis Date  . Atrial fibrillation     CHADS2 score 4  . Hyperlipidemia   . Hypertension   . Hypothyroidism   . TIA (transient ischemic attack)   . Coronary artery disease     Multivessel, BMS prox LAD 06/11,subsequent CABG, LVEF 65%  . Osteoarthritis   . Anxiety   . Tachycardia-bradycardia syndrome     s/p PPM   Past Surgical History  Procedure Date  . Pacemaker insertion     SJM implanted for tachy/brady syndrome  . Coronary artery bypass graft 09/2009    Dr. Lorrin Jackson to LAD,SVG,to RCA  . Cataract extraction w/ intraocular lens implant   . Total abdominal hysterectomy w/ bilateral salpingoophorectomy   . Tonsillectomy   . Breast biopsy     Current Outpatient Prescriptions  Medication Sig Dispense Refill  . aspirin 81 MG tablet Take 81 mg by mouth daily.        . bisacodyl (DULCOLAX) 10 MG suppository Place 10 mg rectally daily as needed.        . diltiazem (CARDIZEM) 120 MG tablet Take 120 mg by mouth daily.        . Ensure Plus (ENSURE PLUS) LIQD Take 237 mLs by mouth 3 (three) times daily.        . hydrochlorothiazide 25 MG tablet Take 25 mg by mouth daily.        Marland Kitchen levothyroxine (SYNTHROID, LEVOTHROID) 50 MCG tablet Take 50 mcg by mouth daily.        Marland Kitchen LORazepam (ATIVAN) 0.5 MG tablet Take 0.5 mg by mouth every 8 (eight) hours.        . metoprolol tartrate (LOPRESSOR) 25 MG tablet Take 12.5 mg by mouth 2 (two) times daily.        . mirtazapine (REMERON) 15 MG tablet Take 7.5 mg by  mouth at bedtime.        . nitroGLYCERIN (NITROSTAT) 0.4 MG SL tablet Place 0.4 mg under the tongue every 5 (five) minutes as needed.        . Omega-3 Fatty Acids (FISH OIL) 1200 MG CAPS Take by mouth 2 (two) times daily.        . pantoprazole (PROTONIX) 40 MG tablet Take 40 mg by mouth daily.        . polyethylene glycol (MIRALAX / GLYCOLAX) packet Take 17 g by mouth daily.        . potassium chloride SA (K-DUR,KLOR-CON) 20 MEQ tablet Take 1 tablet by mouth Daily.      . rosuvastatin (CRESTOR) 10 MG tablet Take 10 mg by mouth daily.        . traMADol (ULTRAM) 50 MG tablet Take 1 tablet by mouth Once daily as needed.      . warfarin (COUMADIN) 2 MG tablet Take 2 mg by mouth daily. Per Dr. Jeannette How office         No Known Allergies  History   Social History  . Marital Status: Married    Spouse Name: N/A    Number of Children: N/A  . Years of  Education: N/A   Occupational History  . Not on file.   Social History Main Topics  . Smoking status: Never Smoker   . Smokeless tobacco: Never Used  . Alcohol Use: No  . Drug Use: Not on file  . Sexually Active: Not on file   Other Topics Concern  . Not on file   Social History Narrative   Lives near [place]    Family History  Problem Relation Age of Onset  . Coronary artery disease Neg Hx    Physical Exam: Filed Vitals:   02/10/11 1606  BP: 119/71  Pulse: 65  Height: 4\' 11"  (1.499 m)  Weight: 110 lb (49.896 kg)    GEN- The patient is elderly appearing, alert and oriented x 3 today.   Head- normocephalic, atraumatic Eyes-  Sclera clear, conjunctiva pink Ears- hearing intact Oropharynx- clear Neck- supple, no JVP Lymph- no cervical lymphadenopathy Lungs- Clear to ausculation bilaterally, normal work of breathing Chest- pacemaker pocket is well healed Heart- Regular rate and rhythm, no murmurs, rubs or gallops, PMI not laterally displaced GI- soft, NT, ND, + BS Extremities- no clubbing, cyanosis, or edema  Pacemaker  interrogation- reviewed in detail today,  See PACEART report  Assessment and Plan:

## 2011-03-31 ENCOUNTER — Ambulatory Visit (INDEPENDENT_AMBULATORY_CARE_PROVIDER_SITE_OTHER): Payer: Medicare Other | Admitting: Cardiology

## 2011-03-31 ENCOUNTER — Encounter: Payer: Self-pay | Admitting: Cardiology

## 2011-03-31 VITALS — BP 138/59 | HR 60 | Ht 59.0 in | Wt 111.0 lb

## 2011-03-31 DIAGNOSIS — I2510 Atherosclerotic heart disease of native coronary artery without angina pectoris: Secondary | ICD-10-CM

## 2011-03-31 DIAGNOSIS — I10 Essential (primary) hypertension: Secondary | ICD-10-CM

## 2011-03-31 DIAGNOSIS — E785 Hyperlipidemia, unspecified: Secondary | ICD-10-CM

## 2011-03-31 DIAGNOSIS — I4891 Unspecified atrial fibrillation: Secondary | ICD-10-CM

## 2011-03-31 NOTE — Assessment & Plan Note (Signed)
Blood pressure reasonable, continue medical therapy.

## 2011-03-31 NOTE — Assessment & Plan Note (Signed)
Continues on Coumadin (followed by [facility]) with history of tachycardia-bradycardia syndrome status post pacemaker placement, followed by Dr. Johney Frame.

## 2011-03-31 NOTE — Patient Instructions (Signed)
Your physician wants you to follow-up in: 6 months. You will receive a reminder letter in the mail one-two months in advance. If you don't receive a letter, please call our office to schedule the follow-up appointment. Your physician recommends that you continue on your current medications as directed. Please refer to the Current Medication list given to you today. Have Dr. [name] draw lab work for lipid panel and liver function tests as discussed with Dr. Diona Browner.

## 2011-03-31 NOTE — Assessment & Plan Note (Signed)
Symptomatically stable following bypass surgery. Continue medical therapy and observation. Reinforced compliance with regular followup.

## 2011-03-31 NOTE — Progress Notes (Signed)
Clinical Summary Latoya Crane is a 78 y.o.female presenting for followup. She has been followed most recently by Latoya Crane. I last saw her in October 2011 and she has missed subsequent followup visits.  Lab work from April of 2012 showed total cholesterol 344, triglycerides 505, HDL 42. She had stopped taking Crestor related to hip pain. Does not appear that she has had any followup with Latoya Crane as yet, or started on any new lipid-lowering medications.  Fortunately, she denies any significant angina and has had no progressive shortness of breath or palpitations. She reports no bleeding problems on Coumadin, followed by [facility].   No Known Allergies  Medication list reviewed.  Past Medical History  Diagnosis Date  . Campath-induced atrial fibrillation     CHADS2 score 4  . Hyperlipidemia   . Hypertension   . Hypothyroidism   . TIA (transient ischemic attack)   . Coronary artery disease     Multivessel, BMS prox LAD 06/11,subsequent CABG, LVEF 65%  . Osteoarthritis   . Anxiety   . Tachycardia-bradycardia syndrome     s/p PPM    Past Surgical History  Procedure Date  . Pacemaker insertion     SJM implanted for tachy/brady syndrome  . Coronary artery bypass graft 09/2009    Latoya Crane to LAD,SVG,to RCA  . Cataract extraction w/ intraocular lens implant   . Total abdominal hysterectomy w/ bilateral salpingoophorectomy   . Tonsillectomy   . Breast biopsy     Family History  Problem Relation Age of Onset  . Coronary artery disease Neg Hx     Social History Latoya Crane reports that she has never smoked. She has never used smokeless tobacco. Latoya Crane reports that she does not drink alcohol.  Review of Systems Reports chronic hip pain and left shoulder pain, possibly arthritic. No falls or syncope. States that her "memory is not so good." Otherwise negative.  Physical Examination Filed Vitals:   03/31/11 1313  BP: 138/59  Pulse: 60    Normally nourished appearing elderly woman in no acute distress. HEENT: Conjunctiva and lids normal, oropharynx clear with moist mucosa. Neck: Supple, no elevated JVP or carotid bruits, no thyromegaly. Lungs: Clear to auscultation, nonlabored breathing at rest. Cardiac: Regular rate and rhythm, no S3, soft systolic murmur. Abdomen: Soft, nontender, bowel sounds present. Extremities: No pitting edema, distal pulses 1-2+.. Skin: Warm and dry. Musculoskeletal: Mild kyphosis. Neuropsychiatric: Alert and oriented x3, affect grossly appropriate.   ECG Reviewed in EMR.    Problem List and Plan

## 2011-03-31 NOTE — Assessment & Plan Note (Signed)
Check followup fasting lipid profile. Will need to discuss initiation of different statin therapy, perhaps Lipitor.

## 2011-09-09 ENCOUNTER — Ambulatory Visit (INDEPENDENT_AMBULATORY_CARE_PROVIDER_SITE_OTHER): Payer: Medicare Other | Admitting: *Deleted

## 2011-09-09 ENCOUNTER — Encounter: Payer: Self-pay | Admitting: Internal Medicine

## 2011-09-09 DIAGNOSIS — I495 Sick sinus syndrome: Secondary | ICD-10-CM

## 2011-09-09 DIAGNOSIS — I4891 Unspecified atrial fibrillation: Secondary | ICD-10-CM

## 2011-09-09 LAB — PACEMAKER DEVICE OBSERVATION
AL IMPEDENCE PM: 387 Ohm
AL THRESHOLD: 0.5 V
ATRIAL PACING PM: 71
BAMS-0003: 70 {beats}/min
RV LEAD THRESHOLD: 0.625 V

## 2011-09-09 NOTE — Progress Notes (Signed)
Pacer check in clinic  

## 2012-02-24 ENCOUNTER — Encounter: Payer: Self-pay | Admitting: *Deleted

## 2012-02-24 DIAGNOSIS — Z950 Presence of cardiac pacemaker: Secondary | ICD-10-CM | POA: Insufficient documentation

## 2012-03-02 ENCOUNTER — Ambulatory Visit (INDEPENDENT_AMBULATORY_CARE_PROVIDER_SITE_OTHER): Payer: Medicare Other | Admitting: Internal Medicine

## 2012-03-02 ENCOUNTER — Encounter: Payer: Self-pay | Admitting: Internal Medicine

## 2012-03-02 VITALS — BP 122/58 | HR 70 | Ht 59.0 in | Wt 107.0 lb

## 2012-03-02 DIAGNOSIS — I4891 Unspecified atrial fibrillation: Secondary | ICD-10-CM

## 2012-03-02 DIAGNOSIS — I495 Sick sinus syndrome: Secondary | ICD-10-CM

## 2012-03-02 DIAGNOSIS — Z950 Presence of cardiac pacemaker: Secondary | ICD-10-CM

## 2012-03-02 LAB — PACEMAKER DEVICE OBSERVATION
AL AMPLITUDE: 5 mv
AL IMPEDENCE PM: 395 Ohm
AL THRESHOLD: 0.5 V
RV LEAD THRESHOLD: 0.75 V

## 2012-03-02 NOTE — Patient Instructions (Addendum)
Your physician wants you to follow-up in: 6 months with device clinic in [place] and 12 months with Dr Johney Frame in [place]  You will receive a reminder letter in the mail two months in advance. If you don't receive a letter, please call our office to schedule the follow-up appointment.

## 2012-03-02 NOTE — Progress Notes (Signed)
PCP: Selinda Flavin, MD  Latoya Crane is a 78 y.o. female who presents today for routine electrophysiology followup.  Since last being seen in our clinic, the patient reports doing very well.  She has occasional SOB but feels that this is stable.  Today, she denies symptoms of palpitations, chest pain,  lower extremity edema, dizziness, presyncope, or syncope.  The patient is otherwise without complaint today.   Past Medical History  Diagnosis Date  . Atrial fibrillation     CHADS2 score 4  . Hyperlipidemia   . Hypertension   . Hypothyroidism   . TIA (transient ischemic attack)   . Coronary artery disease     Multivessel, BMS prox LAD 06/11,subsequent CABG, LVEF 65%  . Osteoarthritis   . Anxiety   . Tachycardia-bradycardia syndrome     s/p PPM   Past Surgical History  Procedure Date  . Pacemaker insertion     SJM implanted for tachy/brady syndrome  . Coronary artery bypass graft 09/2009    Dr. Lorrin Jackson to LAD,SVG,to RCA  . Cataract extraction w/ intraocular lens implant   . Total abdominal hysterectomy w/ bilateral salpingoophorectomy   . Tonsillectomy   . Breast biopsy     Current Outpatient Prescriptions  Medication Sig Dispense Refill  . aspirin 81 MG tablet Take 81 mg by mouth daily.        . bisacodyl (DULCOLAX) 10 MG suppository Place 10 mg rectally daily as needed.        . diltiazem (CARDIZEM CD) 120 MG 24 hr capsule Take 120 mg by mouth 2 (two) times daily.        . Ensure Plus (ENSURE PLUS) LIQD Take 237 mLs by mouth 3 (three) times daily.        . hydrochlorothiazide 25 MG tablet Take 25 mg by mouth daily.        Marland Kitchen levothyroxine (SYNTHROID, LEVOTHROID) 50 MCG tablet Take 50 mcg by mouth daily.        Marland Kitchen LORazepam (ATIVAN) 0.5 MG tablet Take 0.5 mg by mouth every 8 (eight) hours.        . metoprolol tartrate (LOPRESSOR) 25 MG tablet Take 12.5 mg by mouth 2 (two) times daily.        . mirtazapine (REMERON) 15 MG tablet Take 7.5 mg by mouth at bedtime.         . nitroGLYCERIN (NITROSTAT) 0.4 MG SL tablet Place 0.4 mg under the tongue every 5 (five) minutes as needed.        . pantoprazole (PROTONIX) 40 MG tablet Take 40 mg by mouth daily.        . polyethylene glycol (MIRALAX / GLYCOLAX) packet Take 17 g by mouth daily.        . potassium chloride SA (K-DUR,KLOR-CON) 20 MEQ tablet Take 1 tablet by mouth Daily.      . traMADol (ULTRAM) 50 MG tablet Take 1 tablet by mouth every 8 (eight) hours as needed.       . warfarin (COUMADIN) 2 MG tablet Take 2 mg by mouth daily. Per Dr. Jeannette How office         Physical Exam: There were no vitals filed for this visit.  GEN- The patient is well appearing, alert and oriented x 3 today.   Head- normocephalic, atraumatic Eyes-  Sclera clear, conjunctiva pink Ears- hearing intact Oropharynx- clear Lungs- Clear to ausculation bilaterally, normal work of breathing Chest- pacemaker pocket is well healed Heart- Regular rate and rhythm, no murmurs, rubs  or gallops, PMI not laterally displaced GI- soft, NT, ND, + BS Extremities- no clubbing, cyanosis, or edema  Pacemaker interrogation- reviewed in detail today,  See PACEART report  Assessment and Plan:  ATRIAL FIBRILLATION  Stable, without significant afib since her last visit No change required today  Continue coumadin   Tachycardia-bradycardia syndrome  Normal pacemaker function  See Pace Art report  EGMs turned off today to promote battery longevity. Ventricular autocapture also turned off as it had her pacing output inappropriately elevated at 3.5V.  Return to the device clinic in 6 months in [place]

## 2012-09-14 ENCOUNTER — Ambulatory Visit (INDEPENDENT_AMBULATORY_CARE_PROVIDER_SITE_OTHER): Payer: Medicare Other | Admitting: *Deleted

## 2012-09-14 DIAGNOSIS — I4891 Unspecified atrial fibrillation: Secondary | ICD-10-CM

## 2012-09-14 LAB — PACEMAKER DEVICE OBSERVATION
AL AMPLITUDE: 3.9 mv
AL IMPEDENCE PM: 381 Ohm
ATRIAL PACING PM: 73
BATTERY VOLTAGE: 2.78 V
VENTRICULAR PACING PM: 1

## 2012-09-14 NOTE — Progress Notes (Signed)
PPM check in office. 

## 2012-10-18 ENCOUNTER — Encounter: Payer: Self-pay | Admitting: Internal Medicine

## 2013-04-01 ENCOUNTER — Ambulatory Visit (INDEPENDENT_AMBULATORY_CARE_PROVIDER_SITE_OTHER): Payer: Medicare Other | Admitting: Internal Medicine

## 2013-04-01 ENCOUNTER — Encounter: Payer: Medicare Other | Admitting: Internal Medicine

## 2013-04-01 ENCOUNTER — Encounter: Payer: Self-pay | Admitting: Internal Medicine

## 2013-04-01 VITALS — BP 138/66 | HR 60 | Ht 60.0 in | Wt 105.8 lb

## 2013-04-01 DIAGNOSIS — I10 Essential (primary) hypertension: Secondary | ICD-10-CM

## 2013-04-01 DIAGNOSIS — I2510 Atherosclerotic heart disease of native coronary artery without angina pectoris: Secondary | ICD-10-CM

## 2013-04-01 DIAGNOSIS — Z950 Presence of cardiac pacemaker: Secondary | ICD-10-CM

## 2013-04-01 DIAGNOSIS — I4891 Unspecified atrial fibrillation: Secondary | ICD-10-CM

## 2013-04-01 DIAGNOSIS — I495 Sick sinus syndrome: Secondary | ICD-10-CM

## 2013-04-01 LAB — MDC_IDC_ENUM_SESS_TYPE_INCLINIC
Battery Impedance: 1700 Ohm
Date Time Interrogation Session: 20150105091428
Implantable Pulse Generator Serial Number: 2231870
Lead Channel Impedance Value: 366 Ohm
Lead Channel Impedance Value: 376 Ohm
Lead Channel Pacing Threshold Amplitude: 0.75 V
Lead Channel Pacing Threshold Pulse Width: 0.5 ms
Lead Channel Pacing Threshold Pulse Width: 0.5 ms
Lead Channel Setting Sensing Sensitivity: 2 mV
MDC IDC MSMT BATTERY VOLTAGE: 2.76 V
MDC IDC MSMT LEADCHNL RA PACING THRESHOLD AMPLITUDE: 1 V
MDC IDC MSMT LEADCHNL RA SENSING INTR AMPL: 3.8 mV
MDC IDC MSMT LEADCHNL RV SENSING INTR AMPL: 12 mV
MDC IDC SET LEADCHNL RA PACING AMPLITUDE: 2 V
MDC IDC SET LEADCHNL RV PACING AMPLITUDE: 2.5 V
MDC IDC SET LEADCHNL RV PACING PULSEWIDTH: 0.5 ms

## 2013-04-01 NOTE — Patient Instructions (Signed)
Your physician recommeds you follow up with Belenda CruiseKristin in 6 months. You will receive a letter in 4 months. If you don't have a letter around May 2015 please call our office to schedule that appointment.  Your physician recommends that you follow up with Dr. Johney FrameAllred in 1 year. You will receive a letter in 10 months. If you don't have this letter by November 2015 please call our office and schedule this appointment.  Your physician has recommended you make the following change in your medication:  Stop: Aspirin 81 MG  Continue all other meciations the same.

## 2013-04-01 NOTE — Progress Notes (Signed)
PCP: Selinda Flavin, MD  Latoya Crane is a 80 y.o. female who presents today for routine electrophysiology followup.  Since last being seen in our clinic, the patient reports doing very well.  She continues to go to church and remains somewhat active. Today, she denies symptoms of palpitations, chest pain, SOB, lower extremity edema, dizziness, presyncope, or syncope.  The patient is otherwise without complaint today.   Past Medical History  Diagnosis Date  . Atrial fibrillation     CHADS2 score 4  . Hyperlipidemia   . Hypertension   . Hypothyroidism   . TIA (transient ischemic attack)   . Coronary artery disease     Multivessel, BMS prox LAD 06/11,subsequent CABG, LVEF 65%  . Osteoarthritis   . Anxiety   . Tachycardia-bradycardia syndrome     s/p PPM   Past Surgical History  Procedure Laterality Date  . Pacemaker insertion      SJM implanted for tachy/brady syndrome  . Coronary artery bypass graft  09/2009    Dr. Lorrin Jackson to LAD,SVG,to RCA  . Cataract extraction w/ intraocular lens implant    . Total abdominal hysterectomy w/ bilateral salpingoophorectomy    . Tonsillectomy    . Breast biopsy      Current Outpatient Prescriptions  Medication Sig Dispense Refill  . aspirin 81 MG tablet Take 81 mg by mouth daily.        Marland Kitchen docusate sodium (COLACE) 100 MG capsule Take 100 mg by mouth as needed.      . metoprolol tartrate (LOPRESSOR) 25 MG tablet Take 12.5 mg by mouth 2 (two) times daily.        . nitroGLYCERIN (NITROSTAT) 0.4 MG SL tablet Place 0.4 mg under the tongue every 5 (five) minutes as needed.        . potassium chloride SA (K-DUR,KLOR-CON) 20 MEQ tablet Take 1 tablet by mouth Daily.      . traMADol (ULTRAM) 50 MG tablet Take 1 tablet by mouth every 8 (eight) hours as needed.       . warfarin (COUMADIN) 2 MG tablet Take 2 mg by mouth daily. Per Dr. Jeannette How office       . diltiazem (CARDIZEM CD) 120 MG 24 hr capsule Take 120 mg by mouth 2 (two) times daily.         . hydrochlorothiazide 25 MG tablet Take 25 mg by mouth daily.        Marland Kitchen levothyroxine (SYNTHROID, LEVOTHROID) 50 MCG tablet Take 50 mcg by mouth daily.        Marland Kitchen LORazepam (ATIVAN) 0.5 MG tablet Take 0.5 mg by mouth every 8 (eight) hours.        . mirtazapine (REMERON) 15 MG tablet Take 7.5 mg by mouth at bedtime.        . pantoprazole (PROTONIX) 40 MG tablet Take 40 mg by mouth daily.         No current facility-administered medications for this visit.    Physical Exam: Filed Vitals:   04/01/13 0820  BP: 138/66  Pulse: 60  Height: 5' (1.524 m)  Weight: 105 lb 12.8 oz (47.991 kg)    GEN- The patient is well appearing, alert and oriented x 3 today.   Head- normocephalic, atraumatic Eyes-  Sclera clear, conjunctiva pink Ears- hearing intact Oropharynx- clear Lungs- Clear to ausculation bilaterally, normal work of breathing Chest- pacemaker pocket is well healed Heart- Regular rate and rhythm, no murmurs, rubs or gallops, PMI not laterally displaced GI- soft, NT,  ND, + BS Extremities- no clubbing, cyanosis, or edema  Pacemaker interrogation- reviewed in detail today,  See PACEART report  Assessment and Plan:  ATRIAL FIBRILLATION  Stable, without significant afib since her last visit No change required today  Continue coumadin Stop ASA   Tachycardia-bradycardia syndrome  Normal pacemaker function  See Pace Art report  Ventricular autocapture previously  turned off as it had her pacing output inappropriately elevated at 3.5V.  CAD Stable, without ischemic symptoms Stop ASA to reduce bleeding risks  HTN Stable No change required today   [place] I will see again in 1 year

## 2013-04-11 ENCOUNTER — Encounter: Payer: Self-pay | Admitting: Internal Medicine

## 2013-05-28 ENCOUNTER — Encounter: Payer: Self-pay | Admitting: Internal Medicine

## 2013-10-10 ENCOUNTER — Ambulatory Visit (INDEPENDENT_AMBULATORY_CARE_PROVIDER_SITE_OTHER): Payer: Medicare Other | Admitting: *Deleted

## 2013-10-10 DIAGNOSIS — I495 Sick sinus syndrome: Secondary | ICD-10-CM

## 2013-10-10 DIAGNOSIS — I4891 Unspecified atrial fibrillation: Secondary | ICD-10-CM

## 2013-10-10 DIAGNOSIS — I480 Paroxysmal atrial fibrillation: Secondary | ICD-10-CM

## 2013-10-10 LAB — MDC_IDC_ENUM_SESS_TYPE_INCLINIC
Battery Impedance: 1800 Ohm
Brady Statistic RA Percent Paced: 73 %
Brady Statistic RV Percent Paced: 1 %
Date Time Interrogation Session: 20150716134542
Lead Channel Impedance Value: 366 Ohm
Lead Channel Impedance Value: 379 Ohm
Lead Channel Pacing Threshold Amplitude: 0.5 V
Lead Channel Pacing Threshold Pulse Width: 0.5 ms
Lead Channel Pacing Threshold Pulse Width: 0.5 ms
Lead Channel Setting Sensing Sensitivity: 2 mV
MDC IDC MSMT BATTERY VOLTAGE: 2.79 V
MDC IDC MSMT LEADCHNL RA SENSING INTR AMPL: 5 mV
MDC IDC MSMT LEADCHNL RV PACING THRESHOLD AMPLITUDE: 0.5 V
MDC IDC MSMT LEADCHNL RV SENSING INTR AMPL: 12 mV
MDC IDC PG SERIAL: 2231870
MDC IDC SET LEADCHNL RA PACING AMPLITUDE: 2 V
MDC IDC SET LEADCHNL RV PACING AMPLITUDE: 2.5 V
MDC IDC SET LEADCHNL RV PACING PULSEWIDTH: 0.5 ms

## 2013-10-10 NOTE — Progress Notes (Signed)
Pacemaker check in clinic. Battery longevity 3.50 to 6 years. 78 mode switch episodes--longest was 3 hours 38 minutes. + Warfarin. No ventricular high rate episodes. ROV in 6 mths w/JA in [place] office.

## 2013-10-23 ENCOUNTER — Encounter: Payer: Self-pay | Admitting: Internal Medicine

## 2013-11-08 ENCOUNTER — Encounter: Payer: Self-pay | Admitting: Internal Medicine

## 2014-04-01 DIAGNOSIS — F329 Major depressive disorder, single episode, unspecified: Secondary | ICD-10-CM | POA: Diagnosis not present

## 2014-04-01 DIAGNOSIS — F0281 Dementia in other diseases classified elsewhere with behavioral disturbance: Secondary | ICD-10-CM | POA: Diagnosis not present

## 2014-04-01 DIAGNOSIS — I4891 Unspecified atrial fibrillation: Secondary | ICD-10-CM | POA: Diagnosis not present

## 2014-04-04 ENCOUNTER — Ambulatory Visit (INDEPENDENT_AMBULATORY_CARE_PROVIDER_SITE_OTHER): Payer: Medicare Other | Admitting: Internal Medicine

## 2014-04-04 ENCOUNTER — Encounter: Payer: Self-pay | Admitting: Internal Medicine

## 2014-04-04 VITALS — BP 140/58 | HR 69 | Ht 60.0 in | Wt 109.0 lb

## 2014-04-04 DIAGNOSIS — I495 Sick sinus syndrome: Secondary | ICD-10-CM

## 2014-04-04 DIAGNOSIS — I10 Essential (primary) hypertension: Secondary | ICD-10-CM

## 2014-04-04 DIAGNOSIS — I480 Paroxysmal atrial fibrillation: Secondary | ICD-10-CM

## 2014-04-04 LAB — MDC_IDC_ENUM_SESS_TYPE_INCLINIC
Battery Impedance: 2000 Ohm
Battery Voltage: 2.79 V
Brady Statistic RA Percent Paced: 79 %
Date Time Interrogation Session: 20160108133955
Implantable Pulse Generator Serial Number: 2231870
Lead Channel Impedance Value: 373 Ohm
Lead Channel Impedance Value: 381 Ohm
Lead Channel Pacing Threshold Amplitude: 0.5 V
Lead Channel Pacing Threshold Pulse Width: 0.5 ms
Lead Channel Sensing Intrinsic Amplitude: 12 mV
Lead Channel Sensing Intrinsic Amplitude: 4 mV
Lead Channel Setting Pacing Amplitude: 2 V
MDC IDC MSMT LEADCHNL RA PACING THRESHOLD AMPLITUDE: 0.75 V
MDC IDC MSMT LEADCHNL RA PACING THRESHOLD PULSEWIDTH: 0.5 ms
MDC IDC SET LEADCHNL RV PACING AMPLITUDE: 2.5 V
MDC IDC SET LEADCHNL RV PACING PULSEWIDTH: 0.5 ms
MDC IDC SET LEADCHNL RV SENSING SENSITIVITY: 2 mV
MDC IDC STAT BRADY RV PERCENT PACED: 1 %

## 2014-04-04 NOTE — Patient Instructions (Signed)
Your physician recommends that you schedule a follow-up appointment in: 1 year with Dr.Allred. You will receive a reminder letter in the mail in about 10 months reminding you to call and schedule your appointment. If you don't receive this letter, please contact our office. You will be seen in the device clinic at the office in 6 months. You will receive a reminder letter in the mail in about 4 months reminding you to call and schedule your appointment. If you don't receive this letter, please contact our office. Your physician recommends that you continue on your current medications as directed. Please refer to the Current Medication list given to you today.

## 2014-04-04 NOTE — Progress Notes (Signed)
PCP: Selinda Flavin, MD  Latoya Crane is a 81 y.o. female who presents today for routine electrophysiology followup.  Since last being seen in our clinic, the patient reports doing very well.  She continues to be active. Today, she denies symptoms of palpitations, chest pain, SOB, lower extremity edema, dizziness, presyncope, or syncope.  She fell out of bed last week and has bruising along her L neck.  She has been evaluated by Dr [name] who has recommended supportive care.  She denies HA, visual changes, blurred vision, slurred speech, N/V, confusion above baseline, or any new symptoms since her fall.  The patient is otherwise without complaint today.   Past Medical History  Diagnosis Date  . Atrial fibrillation     CHADS2 score 4  . Hyperlipidemia   . Hypertension   . Hypothyroidism   . TIA (transient ischemic attack)   . Coronary artery disease     Multivessel, BMS prox LAD 06/11,subsequent CABG, LVEF 65%  . Osteoarthritis   . Anxiety   . Tachycardia-bradycardia syndrome     s/p PPM   Past Surgical History  Procedure Laterality Date  . Pacemaker insertion      SJM implanted for tachy/brady syndrome  . Coronary artery bypass graft  09/2009    Dr. Lorrin Jackson to LAD,SVG,to RCA  . Cataract extraction w/ intraocular lens implant    . Total abdominal hysterectomy w/ bilateral salpingoophorectomy    . Tonsillectomy    . Breast biopsy      Current Outpatient Prescriptions  Medication Sig Dispense Refill  . ALPRAZolam (XANAX) 0.25 MG tablet Take 0.25 mg by mouth as needed.   5  . diltiazem (CARDIZEM CD) 120 MG 24 hr capsule Take 120 mg by mouth 2 (two) times daily.      Marland Kitchen docusate sodium (COLACE) 100 MG capsule Take 100 mg by mouth as needed.    . hydrochlorothiazide 25 MG tablet Take 25 mg by mouth daily.      Marland Kitchen levothyroxine (SYNTHROID, LEVOTHROID) 50 MCG tablet Take 50 mcg by mouth daily.      . metoprolol tartrate (LOPRESSOR) 25 MG tablet Take 12.5 mg by mouth 2 (two)  times daily.      . mirtazapine (REMERON) 15 MG tablet Take 7.5 mg by mouth at bedtime.      . nitroGLYCERIN (NITROSTAT) 0.4 MG SL tablet Place 0.4 mg under the tongue every 5 (five) minutes as needed.      . pantoprazole (PROTONIX) 40 MG tablet Take 40 mg by mouth daily.      . potassium chloride SA (K-DUR,KLOR-CON) 20 MEQ tablet Take 1 tablet by mouth Daily.    Marland Kitchen warfarin (COUMADIN) 2 MG tablet Take 2 mg by mouth daily. Per Dr. Jeannette How office      No current facility-administered medications for this visit.    Physical Exam: Filed Vitals:   04/04/14 1152  BP: 140/58  Pulse: 69  Height: 5' (1.524 m)  Weight: 109 lb (49.442 kg)  SpO2: 99%    GEN- The patient is well appearing, alert and oriented x 3 today.   Head- normocephalic, atraumatic Eyes-  Sclera clear, conjunctiva pink Ears- hearing intact Oropharynx- clear Lungs- Clear to ausculation bilaterally, normal work of breathing Chest- pacemaker pocket is well healed Heart- Regular rate and rhythm, 2/6 SEM LUSB (early peaking) GI- soft, NT, ND, + BS Extremities- no clubbing, cyanosis, or edema  Pacemaker interrogation- reviewed in detail today,  See PACEART report  Assessment and Plan:  ATRIAL FIBRILLATION  Stable, without significant afib since her last visit No change required today  Continue coumadin   Tachycardia-bradycardia syndrome  Normal pacemaker function See Pace Art report No changes today   CAD Stable, without ischemic symptoms   HTN Stable No change required today   Fall out of med with neck bruising She has been evaluated by Dr Dimas AguasHoward and appears stable.  No s/sx to suggest ICH.  Supportive care is encouraged.  She should follow-up with Dr Dimas AguasHoward for new symptoms or if not improved.    [place] I will see again in 1 year

## 2014-04-10 DIAGNOSIS — H3532 Exudative age-related macular degeneration: Secondary | ICD-10-CM | POA: Diagnosis not present

## 2014-04-10 DIAGNOSIS — H35051 Retinal neovascularization, unspecified, right eye: Secondary | ICD-10-CM | POA: Diagnosis not present

## 2014-05-06 ENCOUNTER — Encounter: Payer: Self-pay | Admitting: Internal Medicine

## 2014-05-13 DIAGNOSIS — I4891 Unspecified atrial fibrillation: Secondary | ICD-10-CM | POA: Diagnosis not present

## 2014-06-11 DIAGNOSIS — I4891 Unspecified atrial fibrillation: Secondary | ICD-10-CM | POA: Diagnosis not present

## 2014-06-12 DIAGNOSIS — H3532 Exudative age-related macular degeneration: Secondary | ICD-10-CM | POA: Diagnosis not present

## 2014-07-11 DIAGNOSIS — I4891 Unspecified atrial fibrillation: Secondary | ICD-10-CM | POA: Diagnosis not present

## 2014-08-11 DIAGNOSIS — I4891 Unspecified atrial fibrillation: Secondary | ICD-10-CM | POA: Diagnosis not present

## 2014-08-21 DIAGNOSIS — H3532 Exudative age-related macular degeneration: Secondary | ICD-10-CM | POA: Diagnosis not present

## 2014-09-01 DIAGNOSIS — I4891 Unspecified atrial fibrillation: Secondary | ICD-10-CM | POA: Diagnosis not present

## 2014-09-30 DIAGNOSIS — E876 Hypokalemia: Secondary | ICD-10-CM | POA: Diagnosis not present

## 2014-09-30 DIAGNOSIS — E780 Pure hypercholesterolemia: Secondary | ICD-10-CM | POA: Diagnosis not present

## 2014-09-30 DIAGNOSIS — I4891 Unspecified atrial fibrillation: Secondary | ICD-10-CM | POA: Diagnosis not present

## 2014-09-30 DIAGNOSIS — E039 Hypothyroidism, unspecified: Secondary | ICD-10-CM | POA: Diagnosis not present

## 2014-09-30 DIAGNOSIS — G301 Alzheimer's disease with late onset: Secondary | ICD-10-CM | POA: Diagnosis not present

## 2014-10-07 DIAGNOSIS — E780 Pure hypercholesterolemia: Secondary | ICD-10-CM | POA: Diagnosis not present

## 2014-10-07 DIAGNOSIS — E039 Hypothyroidism, unspecified: Secondary | ICD-10-CM | POA: Diagnosis not present

## 2014-10-07 DIAGNOSIS — F329 Major depressive disorder, single episode, unspecified: Secondary | ICD-10-CM | POA: Diagnosis not present

## 2014-10-07 DIAGNOSIS — E876 Hypokalemia: Secondary | ICD-10-CM | POA: Diagnosis not present

## 2014-10-07 DIAGNOSIS — F0281 Dementia in other diseases classified elsewhere with behavioral disturbance: Secondary | ICD-10-CM | POA: Diagnosis not present

## 2014-10-07 DIAGNOSIS — I4891 Unspecified atrial fibrillation: Secondary | ICD-10-CM | POA: Diagnosis not present

## 2014-10-21 ENCOUNTER — Encounter: Payer: Self-pay | Admitting: *Deleted

## 2014-10-27 ENCOUNTER — Telehealth: Payer: Self-pay | Admitting: Internal Medicine

## 2014-10-27 NOTE — Telephone Encounter (Signed)
Appt made w/[facility] on 8/19 @ 1530.

## 2014-10-27 NOTE — Telephone Encounter (Signed)
New message     Pt daughter is calling in regards to letter pt received about device check Pt daughter needs clarification on the letter Please call to discuss

## 2014-11-14 ENCOUNTER — Ambulatory Visit (INDEPENDENT_AMBULATORY_CARE_PROVIDER_SITE_OTHER): Payer: Medicare Other | Admitting: *Deleted

## 2014-11-14 DIAGNOSIS — I480 Paroxysmal atrial fibrillation: Secondary | ICD-10-CM

## 2014-11-14 DIAGNOSIS — I495 Sick sinus syndrome: Secondary | ICD-10-CM | POA: Diagnosis not present

## 2014-11-14 LAB — CUP PACEART INCLINIC DEVICE CHECK
Battery Impedance: 2400 Ohm
Battery Voltage: 2.76 V
Date Time Interrogation Session: 20160819120833
Lead Channel Impedance Value: 367 Ohm
Lead Channel Impedance Value: 378 Ohm
Lead Channel Pacing Threshold Pulse Width: 0.5 ms
Lead Channel Sensing Intrinsic Amplitude: 3.9 mV
Lead Channel Setting Pacing Amplitude: 2 V
Lead Channel Setting Pacing Amplitude: 2.5 V
Lead Channel Setting Pacing Pulse Width: 0.5 ms
Lead Channel Setting Sensing Sensitivity: 2 mV
MDC IDC MSMT LEADCHNL RA PACING THRESHOLD AMPLITUDE: 0.75 V
MDC IDC MSMT LEADCHNL RA PACING THRESHOLD PULSEWIDTH: 0.5 ms
MDC IDC MSMT LEADCHNL RV PACING THRESHOLD AMPLITUDE: 0.5 V
MDC IDC MSMT LEADCHNL RV SENSING INTR AMPL: 12 mV — AB
MDC IDC PG SERIAL: 2231870
MDC IDC STAT BRADY RA PERCENT PACED: 70 %
MDC IDC STAT BRADY RV PERCENT PACED: 1 % — AB

## 2014-11-14 NOTE — Progress Notes (Signed)
Pacemaker check in clinic. Normal device function. 26 mode switches (on warfarin)- longest 1.5 hrs. Device programmed at appropriate safety margins. ROV with JA in [place] in February. See PaceArt report for full details.

## 2014-11-18 ENCOUNTER — Encounter: Payer: Self-pay | Admitting: Internal Medicine

## 2014-11-18 DIAGNOSIS — I4891 Unspecified atrial fibrillation: Secondary | ICD-10-CM | POA: Diagnosis not present

## 2014-11-20 DIAGNOSIS — H3532 Exudative age-related macular degeneration: Secondary | ICD-10-CM | POA: Diagnosis not present

## 2014-12-09 DIAGNOSIS — I4891 Unspecified atrial fibrillation: Secondary | ICD-10-CM | POA: Diagnosis not present

## 2015-01-12 DIAGNOSIS — M79602 Pain in left arm: Secondary | ICD-10-CM | POA: Diagnosis not present

## 2015-01-12 DIAGNOSIS — Z880 Allergy status to penicillin: Secondary | ICD-10-CM | POA: Diagnosis not present

## 2015-01-12 DIAGNOSIS — I4892 Unspecified atrial flutter: Secondary | ICD-10-CM | POA: Diagnosis not present

## 2015-01-12 DIAGNOSIS — R079 Chest pain, unspecified: Secondary | ICD-10-CM | POA: Diagnosis not present

## 2015-01-12 DIAGNOSIS — Z951 Presence of aortocoronary bypass graft: Secondary | ICD-10-CM | POA: Diagnosis not present

## 2015-01-12 DIAGNOSIS — Z888 Allergy status to other drugs, medicaments and biological substances status: Secondary | ICD-10-CM | POA: Diagnosis not present

## 2015-01-12 DIAGNOSIS — Z950 Presence of cardiac pacemaker: Secondary | ICD-10-CM | POA: Diagnosis not present

## 2015-01-12 DIAGNOSIS — Z7982 Long term (current) use of aspirin: Secondary | ICD-10-CM | POA: Diagnosis not present

## 2015-01-12 DIAGNOSIS — R2681 Unsteadiness on feet: Secondary | ICD-10-CM | POA: Diagnosis not present

## 2015-01-12 DIAGNOSIS — I4891 Unspecified atrial fibrillation: Secondary | ICD-10-CM | POA: Diagnosis not present

## 2015-01-12 DIAGNOSIS — Z79899 Other long term (current) drug therapy: Secondary | ICD-10-CM | POA: Diagnosis not present

## 2015-01-12 DIAGNOSIS — F0390 Unspecified dementia without behavioral disturbance: Secondary | ICD-10-CM | POA: Diagnosis not present

## 2015-01-12 DIAGNOSIS — Z7901 Long term (current) use of anticoagulants: Secondary | ICD-10-CM | POA: Diagnosis not present

## 2015-01-12 DIAGNOSIS — I2510 Atherosclerotic heart disease of native coronary artery without angina pectoris: Secondary | ICD-10-CM | POA: Diagnosis not present

## 2015-01-12 DIAGNOSIS — I358 Other nonrheumatic aortic valve disorders: Secondary | ICD-10-CM | POA: Diagnosis not present

## 2015-01-12 DIAGNOSIS — R0789 Other chest pain: Secondary | ICD-10-CM | POA: Diagnosis not present

## 2015-01-12 DIAGNOSIS — E039 Hypothyroidism, unspecified: Secondary | ICD-10-CM | POA: Diagnosis not present

## 2015-01-12 DIAGNOSIS — I495 Sick sinus syndrome: Secondary | ICD-10-CM | POA: Diagnosis not present

## 2015-01-12 DIAGNOSIS — I340 Nonrheumatic mitral (valve) insufficiency: Secondary | ICD-10-CM | POA: Diagnosis not present

## 2015-01-12 DIAGNOSIS — I517 Cardiomegaly: Secondary | ICD-10-CM | POA: Diagnosis not present

## 2015-01-13 DIAGNOSIS — R079 Chest pain, unspecified: Secondary | ICD-10-CM | POA: Diagnosis not present

## 2015-01-13 DIAGNOSIS — F0390 Unspecified dementia without behavioral disturbance: Secondary | ICD-10-CM | POA: Diagnosis not present

## 2015-01-13 DIAGNOSIS — I4891 Unspecified atrial fibrillation: Secondary | ICD-10-CM | POA: Diagnosis not present

## 2015-01-20 DIAGNOSIS — I482 Chronic atrial fibrillation: Secondary | ICD-10-CM | POA: Diagnosis not present

## 2015-01-31 DIAGNOSIS — I4891 Unspecified atrial fibrillation: Secondary | ICD-10-CM | POA: Diagnosis not present

## 2015-01-31 DIAGNOSIS — Z8249 Family history of ischemic heart disease and other diseases of the circulatory system: Secondary | ICD-10-CM | POA: Diagnosis not present

## 2015-01-31 DIAGNOSIS — R079 Chest pain, unspecified: Secondary | ICD-10-CM | POA: Diagnosis not present

## 2015-01-31 DIAGNOSIS — Z79899 Other long term (current) drug therapy: Secondary | ICD-10-CM | POA: Diagnosis not present

## 2015-01-31 DIAGNOSIS — Z7901 Long term (current) use of anticoagulants: Secondary | ICD-10-CM | POA: Diagnosis not present

## 2015-01-31 DIAGNOSIS — Z889 Allergy status to unspecified drugs, medicaments and biological substances status: Secondary | ICD-10-CM | POA: Diagnosis not present

## 2015-01-31 DIAGNOSIS — R0682 Tachypnea, not elsewhere classified: Secondary | ICD-10-CM | POA: Diagnosis not present

## 2015-01-31 DIAGNOSIS — E875 Hyperkalemia: Secondary | ICD-10-CM | POA: Diagnosis not present

## 2015-01-31 DIAGNOSIS — I10 Essential (primary) hypertension: Secondary | ICD-10-CM | POA: Diagnosis not present

## 2015-01-31 DIAGNOSIS — F419 Anxiety disorder, unspecified: Secondary | ICD-10-CM | POA: Diagnosis not present

## 2015-01-31 DIAGNOSIS — F0390 Unspecified dementia without behavioral disturbance: Secondary | ICD-10-CM | POA: Diagnosis not present

## 2015-01-31 DIAGNOSIS — Z7982 Long term (current) use of aspirin: Secondary | ICD-10-CM | POA: Diagnosis not present

## 2015-01-31 DIAGNOSIS — Z888 Allergy status to other drugs, medicaments and biological substances status: Secondary | ICD-10-CM | POA: Diagnosis not present

## 2015-01-31 DIAGNOSIS — Z880 Allergy status to penicillin: Secondary | ICD-10-CM | POA: Diagnosis not present

## 2015-01-31 DIAGNOSIS — Z950 Presence of cardiac pacemaker: Secondary | ICD-10-CM | POA: Diagnosis not present

## 2015-01-31 DIAGNOSIS — E039 Hypothyroidism, unspecified: Secondary | ICD-10-CM | POA: Diagnosis not present

## 2015-02-01 DIAGNOSIS — E875 Hyperkalemia: Secondary | ICD-10-CM | POA: Diagnosis not present

## 2015-02-01 DIAGNOSIS — E039 Hypothyroidism, unspecified: Secondary | ICD-10-CM | POA: Diagnosis not present

## 2015-02-01 DIAGNOSIS — Z889 Allergy status to unspecified drugs, medicaments and biological substances status: Secondary | ICD-10-CM | POA: Diagnosis not present

## 2015-02-01 DIAGNOSIS — I4891 Unspecified atrial fibrillation: Secondary | ICD-10-CM | POA: Diagnosis not present

## 2015-02-01 DIAGNOSIS — Z7982 Long term (current) use of aspirin: Secondary | ICD-10-CM | POA: Diagnosis not present

## 2015-02-01 DIAGNOSIS — F0390 Unspecified dementia without behavioral disturbance: Secondary | ICD-10-CM | POA: Diagnosis not present

## 2015-02-01 DIAGNOSIS — R079 Chest pain, unspecified: Secondary | ICD-10-CM | POA: Diagnosis not present

## 2015-02-01 DIAGNOSIS — Z950 Presence of cardiac pacemaker: Secondary | ICD-10-CM | POA: Diagnosis not present

## 2015-02-01 DIAGNOSIS — Z79899 Other long term (current) drug therapy: Secondary | ICD-10-CM | POA: Diagnosis not present

## 2015-02-01 DIAGNOSIS — Z880 Allergy status to penicillin: Secondary | ICD-10-CM | POA: Diagnosis not present

## 2015-02-01 DIAGNOSIS — I10 Essential (primary) hypertension: Secondary | ICD-10-CM | POA: Diagnosis not present

## 2015-02-02 DIAGNOSIS — Z23 Encounter for immunization: Secondary | ICD-10-CM | POA: Diagnosis not present

## 2015-02-12 DIAGNOSIS — H353211 Exudative age-related macular degeneration, right eye, with active choroidal neovascularization: Secondary | ICD-10-CM | POA: Diagnosis not present

## 2015-02-17 DIAGNOSIS — E039 Hypothyroidism, unspecified: Secondary | ICD-10-CM | POA: Diagnosis not present

## 2015-02-17 DIAGNOSIS — E7800 Pure hypercholesterolemia, unspecified: Secondary | ICD-10-CM | POA: Diagnosis not present

## 2015-02-17 DIAGNOSIS — E876 Hypokalemia: Secondary | ICD-10-CM | POA: Diagnosis not present

## 2015-02-24 DIAGNOSIS — I208 Other forms of angina pectoris: Secondary | ICD-10-CM | POA: Diagnosis not present

## 2015-02-24 DIAGNOSIS — E039 Hypothyroidism, unspecified: Secondary | ICD-10-CM | POA: Diagnosis not present

## 2015-02-24 DIAGNOSIS — F0281 Dementia in other diseases classified elsewhere with behavioral disturbance: Secondary | ICD-10-CM | POA: Diagnosis not present

## 2015-02-24 DIAGNOSIS — I482 Chronic atrial fibrillation: Secondary | ICD-10-CM | POA: Diagnosis not present

## 2015-03-19 DIAGNOSIS — H353211 Exudative age-related macular degeneration, right eye, with active choroidal neovascularization: Secondary | ICD-10-CM | POA: Diagnosis not present

## 2015-04-07 DIAGNOSIS — I482 Chronic atrial fibrillation: Secondary | ICD-10-CM | POA: Diagnosis not present

## 2015-04-14 DIAGNOSIS — I482 Chronic atrial fibrillation: Secondary | ICD-10-CM | POA: Diagnosis not present

## 2015-04-27 DIAGNOSIS — H353211 Exudative age-related macular degeneration, right eye, with active choroidal neovascularization: Secondary | ICD-10-CM | POA: Diagnosis not present

## 2015-05-01 ENCOUNTER — Encounter: Payer: Self-pay | Admitting: Internal Medicine

## 2015-05-01 ENCOUNTER — Ambulatory Visit (INDEPENDENT_AMBULATORY_CARE_PROVIDER_SITE_OTHER): Payer: Medicare Other | Admitting: Internal Medicine

## 2015-05-01 VITALS — BP 98/58 | HR 72 | Ht 60.0 in | Wt 114.0 lb

## 2015-05-01 DIAGNOSIS — I480 Paroxysmal atrial fibrillation: Secondary | ICD-10-CM

## 2015-05-01 DIAGNOSIS — I495 Sick sinus syndrome: Secondary | ICD-10-CM

## 2015-05-01 DIAGNOSIS — I10 Essential (primary) hypertension: Secondary | ICD-10-CM

## 2015-05-01 LAB — CUP PACEART INCLINIC DEVICE CHECK
Battery Impedance: 2800 Ohm
Battery Voltage: 2.75 V
Brady Statistic RA Percent Paced: 66 %
Brady Statistic RV Percent Paced: 1 % — CL
Date Time Interrogation Session: 20170203145955
Implantable Lead Implant Date: 20091022
Implantable Lead Implant Date: 20091022
Implantable Lead Location: 753859
Implantable Lead Location: 753860
Lead Channel Impedance Value: 401 Ohm
Lead Channel Impedance Value: 403 Ohm
Lead Channel Pacing Threshold Amplitude: 0.5 V
Lead Channel Pacing Threshold Amplitude: 0.5 V
Lead Channel Pacing Threshold Pulse Width: 0.5 ms
Lead Channel Pacing Threshold Pulse Width: 0.5 ms
Lead Channel Sensing Intrinsic Amplitude: 12 mV
Lead Channel Sensing Intrinsic Amplitude: 5 mV
Lead Channel Setting Pacing Amplitude: 2 V
Lead Channel Setting Pacing Amplitude: 2.5 V
Lead Channel Setting Pacing Pulse Width: 0.5 ms
Lead Channel Setting Sensing Sensitivity: 2 mV
Pulse Gen Model: 5826
Pulse Gen Serial Number: 2231870

## 2015-05-01 MED ORDER — HYDROCHLOROTHIAZIDE 25 MG PO TABS: 12.5000 mg | ORAL_TABLET | Freq: Every day | ORAL | Status: DC

## 2015-05-01 NOTE — Addendum Note (Signed)
Addended by: Eustace Moore on: 05/01/2015 12:29 PM   Modules accepted: Orders

## 2015-05-01 NOTE — Progress Notes (Signed)
PCP: Selinda Flavin, MD  Latoya Crane is a 82 y.o. female who presents today for routine electrophysiology followup.  Since last being seen in our clinic, the patient reports doing reasonably well.  Her daughter states that she is a lot less active. Today, she denies symptoms of palpitations, chest pain, SOB, lower extremity edema, dizziness, presyncope, or syncope.   The patient is otherwise without complaint today.   Past Medical History  Diagnosis Date  . Atrial fibrillation (HCC)     CHADS2 score 4  . Hyperlipidemia   . Hypertension   . Hypothyroidism   . TIA (transient ischemic attack)   . Coronary artery disease     Multivessel, BMS prox LAD 06/11,subsequent CABG, LVEF 65%  . Osteoarthritis   . Anxiety   . Tachycardia-bradycardia syndrome (HCC)     s/p PPM   Past Surgical History  Procedure Laterality Date  . Pacemaker insertion      SJM implanted for tachy/brady syndrome  . Coronary artery bypass graft  09/2009    Dr. Lorrin Jackson to LAD,SVG,to RCA  . Cataract extraction w/ intraocular lens implant    . Total abdominal hysterectomy w/ bilateral salpingoophorectomy    . Tonsillectomy    . Breast biopsy      Current Outpatient Prescriptions  Medication Sig Dispense Refill  . ALPRAZolam (XANAX) 0.25 MG tablet Take 0.025 mg by mouth as needed.   5  . aspirin 81 MG tablet Take 81 mg by mouth daily.    Marland Kitchen diltiazem (CARDIZEM CD) 120 MG 24 hr capsule Take 120 mg by mouth 2 (two) times daily.      Marland Kitchen docusate sodium (COLACE) 100 MG capsule Take 100 mg by mouth as needed.    . hydrochlorothiazide 25 MG tablet Take 25 mg by mouth daily.      . isosorbide mononitrate (IMDUR) 30 MG 24 hr tablet TAKE ONE TABLET BY MOUTH DAILY (MORNING)  5  . levothyroxine (SYNTHROID, LEVOTHROID) 50 MCG tablet Take 50 mcg by mouth daily.      . metoprolol tartrate (LOPRESSOR) 25 MG tablet Take 12.5 mg by mouth 2 (two) times daily.      . mirtazapine (REMERON) 15 MG tablet Take 15 mg by mouth  at bedtime.     . Multiple Vitamins-Minerals (MULTIVITAMIN GUMMIES ADULT PO) Take 1 tablet by mouth 2 (two) times daily.    . Multiple Vitamins-Minerals (PRESERVISION AREDS 2 PO) Take 1 tablet by mouth 2 (two) times daily.    . nitroGLYCERIN (NITROSTAT) 0.4 MG SL tablet Place 0.4 mg under the tongue every 5 (five) minutes as needed.      . pantoprazole (PROTONIX) 40 MG tablet Take 40 mg by mouth daily.      . potassium chloride SA (K-DUR,KLOR-CON) 20 MEQ tablet Take 1 tablet by mouth Daily.    Marland Kitchen warfarin (COUMADIN) 2 MG tablet Take 2 mg by mouth daily. Per Dr. Jeannette How office      No current facility-administered medications for this visit.    Physical Exam: Filed Vitals:   05/01/15 1144  BP: 98/58  Pulse: 72  Height: 5' (1.524 m)  Weight: 114 lb (51.71 kg)  SpO2: 96%    GEN- The patient is well appearing, alert and oriented x 3 today.   Head- normocephalic, atraumatic Eyes-  Sclera clear, conjunctiva pink Ears- hearing intact Oropharynx- clear Lungs- Clear to ausculation bilaterally, normal work of breathing Chest- pacemaker pocket is well healed Heart- Regular rate and rhythm, 2/6 SEM LUSB (early  peaking) GI- soft, NT, ND, + BS Extremities- no clubbing, cyanosis, or edema  Pacemaker interrogation- reviewed in detail today,  See PACEART report  Assessment and Plan:  ATRIAL FIBRILLATION  Stable, without symptoms No change required today  Continue coumadin   Tachycardia-bradycardia syndrome  Normal pacemaker function See Pace Art report No changes today   CAD Stable, without ischemic symptoms   HTN BP is low today Will reduce hctz to 12.5mg  daily She is due to see Dr [name] and have labs in 2 weeks   Return to the device clinic in 6 months in [place] I will see again in 1 year  Hillis Range MD, [facility] 05/01/2015 12:23 PM

## 2015-05-01 NOTE — Patient Instructions (Addendum)
Your physician has recommended you make the following change in your medication:  Decrease hydrochlorothiazide to 12.5 mg daily. You may break your table in half daily until they are finished. Continue all other medications the same. Your physician recommends that you schedule a follow-up appointment in: 6 months. You will receive a reminder letter in the mail in about 4 months reminding you to call and schedule your appointment. If you don't receive this letter, please contact our office. Your physician recommends that you schedule a follow-up appointment in: 1 year with Dr. Johney Frame.

## 2015-05-15 DIAGNOSIS — I482 Chronic atrial fibrillation: Secondary | ICD-10-CM | POA: Diagnosis not present

## 2015-06-02 DIAGNOSIS — H353211 Exudative age-related macular degeneration, right eye, with active choroidal neovascularization: Secondary | ICD-10-CM | POA: Diagnosis not present

## 2015-06-09 DIAGNOSIS — I482 Chronic atrial fibrillation: Secondary | ICD-10-CM | POA: Diagnosis not present

## 2015-06-09 DIAGNOSIS — E876 Hypokalemia: Secondary | ICD-10-CM | POA: Diagnosis not present

## 2015-06-09 DIAGNOSIS — E039 Hypothyroidism, unspecified: Secondary | ICD-10-CM | POA: Diagnosis not present

## 2015-06-17 DIAGNOSIS — E039 Hypothyroidism, unspecified: Secondary | ICD-10-CM | POA: Diagnosis not present

## 2015-06-17 DIAGNOSIS — I482 Chronic atrial fibrillation: Secondary | ICD-10-CM | POA: Diagnosis not present

## 2015-07-07 DIAGNOSIS — H353211 Exudative age-related macular degeneration, right eye, with active choroidal neovascularization: Secondary | ICD-10-CM | POA: Diagnosis not present

## 2015-07-29 DIAGNOSIS — I482 Chronic atrial fibrillation: Secondary | ICD-10-CM | POA: Diagnosis not present

## 2015-08-17 DIAGNOSIS — I482 Chronic atrial fibrillation: Secondary | ICD-10-CM | POA: Diagnosis not present

## 2015-08-17 DIAGNOSIS — H6091 Unspecified otitis externa, right ear: Secondary | ICD-10-CM | POA: Diagnosis not present

## 2015-08-18 DIAGNOSIS — H353211 Exudative age-related macular degeneration, right eye, with active choroidal neovascularization: Secondary | ICD-10-CM | POA: Diagnosis not present

## 2015-08-26 DIAGNOSIS — H6091 Unspecified otitis externa, right ear: Secondary | ICD-10-CM | POA: Diagnosis not present

## 2015-09-09 DIAGNOSIS — I482 Chronic atrial fibrillation: Secondary | ICD-10-CM | POA: Diagnosis not present

## 2015-09-30 DIAGNOSIS — R05 Cough: Secondary | ICD-10-CM | POA: Diagnosis not present

## 2015-09-30 DIAGNOSIS — J069 Acute upper respiratory infection, unspecified: Secondary | ICD-10-CM | POA: Diagnosis not present

## 2015-10-12 DIAGNOSIS — E039 Hypothyroidism, unspecified: Secondary | ICD-10-CM | POA: Diagnosis not present

## 2015-10-15 DIAGNOSIS — E039 Hypothyroidism, unspecified: Secondary | ICD-10-CM | POA: Diagnosis not present

## 2015-10-15 DIAGNOSIS — Z6823 Body mass index (BMI) 23.0-23.9, adult: Secondary | ICD-10-CM | POA: Diagnosis not present

## 2015-10-15 DIAGNOSIS — I482 Chronic atrial fibrillation: Secondary | ICD-10-CM | POA: Diagnosis not present

## 2015-10-19 DIAGNOSIS — H353211 Exudative age-related macular degeneration, right eye, with active choroidal neovascularization: Secondary | ICD-10-CM | POA: Diagnosis not present

## 2015-10-30 ENCOUNTER — Encounter: Payer: Self-pay | Admitting: Internal Medicine

## 2015-10-30 ENCOUNTER — Ambulatory Visit (INDEPENDENT_AMBULATORY_CARE_PROVIDER_SITE_OTHER): Payer: Medicare Other | Admitting: *Deleted

## 2015-10-30 DIAGNOSIS — I480 Paroxysmal atrial fibrillation: Secondary | ICD-10-CM | POA: Diagnosis not present

## 2015-10-30 LAB — CUP PACEART INCLINIC DEVICE CHECK
Battery Impedance: 3300 Ohm
Implantable Lead Implant Date: 20091022
Lead Channel Impedance Value: 385 Ohm
Lead Channel Pacing Threshold Amplitude: 0.5 V
Lead Channel Pacing Threshold Pulse Width: 0.5 ms
Lead Channel Sensing Intrinsic Amplitude: 12 mV
Lead Channel Setting Pacing Amplitude: 2 V
Lead Channel Setting Sensing Sensitivity: 2 mV
MDC IDC LEAD IMPLANT DT: 20091022
MDC IDC LEAD LOCATION: 753859
MDC IDC LEAD LOCATION: 753860
MDC IDC MSMT BATTERY VOLTAGE: 2.74 V
MDC IDC MSMT LEADCHNL RA SENSING INTR AMPL: 2.6 mV
MDC IDC MSMT LEADCHNL RV IMPEDANCE VALUE: 392 Ohm
MDC IDC PG SERIAL: 2231870
MDC IDC SESS DTM: 20170804144337
MDC IDC SET LEADCHNL RV PACING AMPLITUDE: 2.5 V
MDC IDC SET LEADCHNL RV PACING PULSEWIDTH: 0.5 ms

## 2015-10-30 NOTE — Progress Notes (Signed)
Pacemaker check in clinic. Normal device function. Thresholds, sensing, impedances consistent with previous measurements. Device programmed to maximize longevity. 144 mode switches, ( 81%) burden + coumadin. Device programmed at appropriate safety margins. Histogram distribution appropriate for patient activity level. Device programmed to optimize intrinsic conduction. Estimated longevity 2-3.72yrs. ROV with JA 04/2015

## 2015-11-17 DIAGNOSIS — S40812A Abrasion of left upper arm, initial encounter: Secondary | ICD-10-CM | POA: Diagnosis not present

## 2015-11-17 DIAGNOSIS — L0390 Cellulitis, unspecified: Secondary | ICD-10-CM | POA: Diagnosis not present

## 2015-11-17 DIAGNOSIS — Z6823 Body mass index (BMI) 23.0-23.9, adult: Secondary | ICD-10-CM | POA: Diagnosis not present

## 2015-11-18 DIAGNOSIS — Z23 Encounter for immunization: Secondary | ICD-10-CM | POA: Diagnosis not present

## 2015-11-24 DIAGNOSIS — Z6823 Body mass index (BMI) 23.0-23.9, adult: Secondary | ICD-10-CM | POA: Diagnosis not present

## 2015-11-24 DIAGNOSIS — S40812D Abrasion of left upper arm, subsequent encounter: Secondary | ICD-10-CM | POA: Diagnosis not present

## 2015-12-07 DIAGNOSIS — H353211 Exudative age-related macular degeneration, right eye, with active choroidal neovascularization: Secondary | ICD-10-CM | POA: Diagnosis not present

## 2016-01-08 DIAGNOSIS — G301 Alzheimer's disease with late onset: Secondary | ICD-10-CM | POA: Diagnosis not present

## 2016-01-08 DIAGNOSIS — Z6823 Body mass index (BMI) 23.0-23.9, adult: Secondary | ICD-10-CM | POA: Diagnosis not present

## 2016-01-08 DIAGNOSIS — Z23 Encounter for immunization: Secondary | ICD-10-CM | POA: Diagnosis not present

## 2016-01-19 DIAGNOSIS — I482 Chronic atrial fibrillation: Secondary | ICD-10-CM | POA: Diagnosis not present

## 2016-01-19 DIAGNOSIS — E039 Hypothyroidism, unspecified: Secondary | ICD-10-CM | POA: Diagnosis not present

## 2016-01-25 DIAGNOSIS — H353211 Exudative age-related macular degeneration, right eye, with active choroidal neovascularization: Secondary | ICD-10-CM | POA: Diagnosis not present

## 2016-02-29 DIAGNOSIS — H353211 Exudative age-related macular degeneration, right eye, with active choroidal neovascularization: Secondary | ICD-10-CM | POA: Diagnosis not present

## 2016-03-14 ENCOUNTER — Ambulatory Visit (INDEPENDENT_AMBULATORY_CARE_PROVIDER_SITE_OTHER): Payer: Medicare Other | Admitting: Neurology

## 2016-03-14 ENCOUNTER — Encounter: Payer: Self-pay | Admitting: Neurology

## 2016-03-14 VITALS — BP 118/72 | HR 71 | Ht 60.0 in | Wt 110.3 lb

## 2016-03-14 DIAGNOSIS — F0391 Unspecified dementia with behavioral disturbance: Secondary | ICD-10-CM

## 2016-03-14 DIAGNOSIS — F03B18 Unspecified dementia, moderate, with other behavioral disturbance: Secondary | ICD-10-CM

## 2016-03-14 MED ORDER — DIVALPROEX SODIUM 125 MG PO CSDR: DELAYED_RELEASE_CAPSULE | ORAL | 5 refills | Status: DC

## 2016-03-14 NOTE — Patient Instructions (Signed)
1. Start Depakote sprinkles 125mg : Take 1 capsule at night for 1 week, then increase to 1 capsule twice a day. Depending on how she is doing, we can further increase dose 2. Continue Quetiapine 50mg  twice a day 3. Take alprazolam only as needed, minimize as much as possible 4. Continue 24/7 care, recommend looking into nursing homes/Memory Care facilities 5. Follow-up in 3 months

## 2016-03-14 NOTE — Progress Notes (Signed)
NEUROLOGY CONSULTATION NOTE  NATORIA ARCHIBALD MRN: [id number] DOB: May 02, 1924  Referring provider: Dr. Selinda Flavin Primary care provider: Dr. Selinda Flavin  Reason for consult:  dementia  Dear Dr [name]:  Thank you for your kind referral of Briah TAYANNA TALFORD for consultation of the above symptoms. Although her history is well known to you, please allow me to reiterate it for the purpose of our medical record. The patient was accompanied to the clinic by her 2 daughters who also provides collateral information. Records and images were personally reviewed where available.  HISTORY OF PRESENT ILLNESS: This is an 82 year old right-handed woman with a history of atrial fibrillation, tachy-brady syndrome s/p pacemaker placement, CAD s/p CABG, hypertension, hyperlipidemia, hypothyroidism, presenting for evaluation of behavioral changes with dementia. She states she lives with her parents (she lives with her daughter). She is a poor historian and mostly keeps her head bent down in her wheelchair. When asked about her memory, she states "I think it's okay." Her daughter reports that she used to live with a different daughter but started having issues and moved to her other daughter 2 years ago. Her other sister did mention she had dementia and that she tried a medication but this did not work or she had adverse effects. Her daughter states "she can't remember nothing." They have to repeat themselves over and over. Symptoms worsened after her husband passed away last 12-06-2022, she now asks repeatedly where her husband is. She walks around the house looking for him, writing him notes why did he leave her. She calls her parents and states she has a cousin who lives down the road. She would want to go out at 2pm and gets in a rage because she wants to go home, but the home she want to go to is her parents' home. It appears prior to her husband's death, he was doing everything for her, including  administering medications. Now her medications come in a bubble pack, but her daughters report that she is not swallowing her pills, instead she chews them or lets it dissolve in her mouth. Sometimes they find the pill on the bathroom floor or inside her purse. They now watch her take the medication, but she still tries to chew it (her BP medication is a capsule that she tries to chew). She is always frustrated and anxious. She gets very little sleep, she tries to get out of the house. She was started on Seroquel, and is now taking a total of 100mg  in the evening (50mg  at 3pm, 50mg  at night), with minimal effect. She has hallucinations, sometimes she says she saw her father or mother that morning, and where did they go. Sometimes she does not recognize her daughter. Her family is starting to have caregiver fatigue as well. She does not drive. She has 24/7 care at home, she can feed herself. She needs help with dressing and bathing, more since 12-06-2022. There is no known family history of dementia (she was adopted). No history of head injuries or alcohol intake.   She denies any headaches, dizziness, diplopia, dysarthria, dysphagia, neck/back pain, focal numbness/tingling/weakness, bowel/bladder dysfunction. No anosmia, tremors, no falls. Her daughter reports she may pass out when she is very constipated.   No brain imaging available for review.  PAST MEDICAL HISTORY: Past Medical History:  Diagnosis Date  . Anxiety   . Atrial fibrillation (HCC)    CHADS2 score 4  . Coronary artery disease    Multivessel, BMS prox  LAD 06/11,subsequent CABG, LVEF 65%  . Hyperlipidemia   . Hypertension   . Hypothyroidism   . Osteoarthritis   . Tachycardia-bradycardia syndrome (HCC)    s/p PPM  . TIA (transient ischemic attack)     PAST SURGICAL HISTORY: Past Surgical History:  Procedure Laterality Date  . BREAST BIOPSY    . CATARACT EXTRACTION W/ INTRAOCULAR LENS IMPLANT    . CORONARY ARTERY BYPASS GRAFT   09/2009   Dr. Lorrin JacksonGerhardt,[place] to LAD,SVG,to RCA  . PACEMAKER INSERTION     SJM implanted for tachy/brady syndrome  . TONSILLECTOMY    . TOTAL ABDOMINAL HYSTERECTOMY W/ BILATERAL SALPINGOOPHORECTOMY      MEDICATIONS: Current Outpatient Prescriptions on File Prior to Visit  Medication Sig Dispense Refill  . ALPRAZolam (XANAX) 0.25 MG tablet Take 0.025 mg by mouth as needed.   5  . aspirin 81 MG tablet Take 81 mg by mouth daily.    Marland Kitchen. diltiazem (CARDIZEM CD) 120 MG 24 hr capsule Take 120 mg by mouth 2 (two) times daily.      Marland Kitchen. docusate sodium (COLACE) 100 MG capsule Take 100 mg by mouth as needed.    . hydrochlorothiazide (HYDRODIURIL) 25 MG tablet Take 0.5 tablets (12.5 mg total) by mouth daily. 05/01/15 dose decrease 45 tablet 3  . isosorbide mononitrate (IMDUR) 30 MG 24 hr tablet TAKE ONE TABLET BY MOUTH DAILY (MORNING)  5  . levothyroxine (SYNTHROID, LEVOTHROID) 50 MCG tablet Take 50 mcg by mouth daily.      . metoprolol tartrate (LOPRESSOR) 25 MG tablet Take 12.5 mg by mouth 2 (two) times daily.      . mirtazapine (REMERON) 15 MG tablet Take 15 mg by mouth at bedtime.     . Multiple Vitamins-Minerals (MULTIVITAMIN GUMMIES ADULT PO) Take 1 tablet by mouth 2 (two) times daily.    . Multiple Vitamins-Minerals (PRESERVISION AREDS 2 PO) Take 1 tablet by mouth 2 (two) times daily.    . pantoprazole (PROTONIX) 40 MG tablet Take 40 mg by mouth daily.      . potassium chloride SA (K-DUR,KLOR-CON) 20 MEQ tablet Take 1 tablet by mouth Daily.    Marland Kitchen. warfarin (COUMADIN) 2 MG tablet Take 2 mg by mouth daily. Per Dr. Jeannette HowHoward's office  Alternating 1/2 and whole pill each day.    . nitroGLYCERIN (NITROSTAT) 0.4 MG SL tablet Place 0.4 mg under the tongue every 5 (five) minutes as needed.       No current facility-administered medications on file prior to visit.     ALLERGIES: No Known Allergies  FAMILY HISTORY: Family History  Problem Relation Age of Onset  . Coronary artery disease Neg Hx      SOCIAL HISTORY: Social History   Social History  . Marital status: Married    Spouse name: N/A  . Number of children: N/A  . Years of education: N/A   Occupational History  . Not on file.   Social History Main Topics  . Smoking status: Never Smoker  . Smokeless tobacco: Never Used  . Alcohol use No  . Drug use: Unknown  . Sexual activity: Not on file   Other Topics Concern  . Not on file   Social History Narrative   [place]    REVIEW OF SYSTEMS: Constitutional: No fevers, chills, or sweats, no generalized fatigue, change in appetite Eyes: No visual changes, double vision, eye pain Ear, nose and throat: No hearing loss, ear pain, nasal congestion, sore throat Cardiovascular: No chest pain, palpitations  Respiratory:  No shortness of breath at rest or with exertion, wheezes GastrointestinaI: No nausea, vomiting, diarrhea, abdominal pain, fecal incontinence Genitourinary:  No dysuria, urinary retention or frequency Musculoskeletal:  No neck pain, back pain Integumentary: No rash, pruritus, skin lesions Neurological: as above Psychiatric: No depression, insomnia, anxiety Endocrine: No palpitations, fatigue, diaphoresis, mood swings, change in appetite, change in weight, increased thirst Hematologic/Lymphatic:  No anemia, purpura, petechiae. Allergic/Immunologic: no itchy/runny eyes, nasal congestion, recent allergic reactions, rashes  PHYSICAL EXAM: Vitals:   03/14/16 1257  BP: 118/72  Pulse: 71   General: No acute distress, flat affect, sitting on a wheelchair and mostly keeps head bent down Head:  Normocephalic/atraumatic Eyes: Fundoscopic exam shows bilateral sharp discs, no vessel changes, exudates, or hemorrhages Neck: supple, no paraspinal tenderness, full range of motion Back: No paraspinal tenderness Heart: regular rate and rhythm Lungs: Clear to auscultation bilaterally. Vascular: No carotid bruits. Skin/Extremities: No rash, no  edema Neurological Exam: Mental status: alert and oriented to person, city/state. No dysarthria or aphasia, Fund of knowledge is reduced.  Recent and remote memory are impaired. Attention and concentration are reduced.   Able to name objects and repeat phrases.  MMSE - Mini Mental State Exam 03/17/2016  Orientation to time 0  Orientation to Place 3  Registration 3  Attention/ Calculation 0  Recall 0  Language- name 2 objects 2  Language- repeat 1  Language- follow 3 step command 3  Language- read & follow direction 0  Write a sentence 1  Copy design 0  Total score 13   Cranial nerves: CN I: not tested CN II: pupils equal, round and reactive to light, visual fields intact, fundi unremarkable. CN III, IV, VI:  full range of motion, no nystagmus, no ptosis CN V: facial sensation intact CN VII: upper and lower face symmetric CN VIII: hearing intact to finger rub CN IX, X: gag intact, uvula midline CN XI: sternocleidomastoid and trapezius muscles intact CN XII: tongue midline Bulk & Tone: normal, no fasciculations. Motor: 5/5 throughout with no pronator drift. Sensation: intact to light touch, cold, pin, vibration and joint position sense.  No extinction to double simultaneous stimulation.  Deep Tendon Reflexes: +2 throughout, no ankle clonus Plantar responses: downgoing bilaterally Cerebellar: no incoordination on finger to nose Gait: not tested, sitting on wheelchair Tremor: none  IMPRESSION: This is an 82 year old right-handed woman with a history of atrial fibrillation, tachy-brady syndrome s/p pacemaker placement, CAD s/p CABG, hypertension, hyperlipidemia, hypothyroidism, presenting for evaluation of behavioral changes with dementia. Her MMSE today is 13/20, indicating moderate dementia. She is having behavioral and personality changes as part of the dementia, and likely has depression from her husband's recent passing. She is taking Remeron and Seroquel with no effect per  daughters. We will add on Depakote for the behaviors, side effects were discussed. She is chewing capsules and ER tablets, and was instructed to call her doctors regarding switching formulations of her medications. Depakote sprinkles will be called in, take 125mg  qhs x 1 week, then increase to BID. We can further uptitrate as tolerated. They can open up the capsules and sprinkle on applesauce. Her daughter reports giving the alprazolam on a daily basis instead of prn, we discussed minimizing benzodiazepine use in the elderly, this may worsen cognition. Continue 24/7 care, we discussed home safety and at this point would recommend moving to a higher level of care such as a Memory Care facility. She will follow-up in 3 months.   Thank you  for allowing me to participate in the care of this patient. Please do not hesitate to call for any questions or concerns.   Patrcia DollyKaren Brittiny Levitz, M.D.  CC: Dr. Dimas AguasHoward

## 2016-03-15 ENCOUNTER — Other Ambulatory Visit: Payer: Self-pay | Admitting: Internal Medicine

## 2016-03-17 DIAGNOSIS — F03B18 Unspecified dementia, moderate, with other behavioral disturbance: Secondary | ICD-10-CM | POA: Insufficient documentation

## 2016-03-17 DIAGNOSIS — F0391 Unspecified dementia with behavioral disturbance: Secondary | ICD-10-CM | POA: Insufficient documentation

## 2016-04-05 DIAGNOSIS — H353211 Exudative age-related macular degeneration, right eye, with active choroidal neovascularization: Secondary | ICD-10-CM | POA: Diagnosis not present

## 2016-04-18 DIAGNOSIS — E039 Hypothyroidism, unspecified: Secondary | ICD-10-CM | POA: Diagnosis not present

## 2016-04-21 DIAGNOSIS — M47815 Spondylosis without myelopathy or radiculopathy, thoracolumbar region: Secondary | ICD-10-CM | POA: Diagnosis not present

## 2016-04-21 DIAGNOSIS — E039 Hypothyroidism, unspecified: Secondary | ICD-10-CM | POA: Diagnosis not present

## 2016-04-21 DIAGNOSIS — I482 Chronic atrial fibrillation: Secondary | ICD-10-CM | POA: Diagnosis not present

## 2016-04-21 DIAGNOSIS — M25551 Pain in right hip: Secondary | ICD-10-CM | POA: Diagnosis not present

## 2016-04-29 ENCOUNTER — Ambulatory Visit (INDEPENDENT_AMBULATORY_CARE_PROVIDER_SITE_OTHER): Payer: Medicare Other | Admitting: Internal Medicine

## 2016-04-29 VITALS — BP 95/57 | HR 72 | Wt 114.0 lb

## 2016-04-29 DIAGNOSIS — I482 Chronic atrial fibrillation: Secondary | ICD-10-CM | POA: Diagnosis not present

## 2016-04-29 DIAGNOSIS — I480 Paroxysmal atrial fibrillation: Secondary | ICD-10-CM

## 2016-04-29 DIAGNOSIS — Z7982 Long term (current) use of aspirin: Secondary | ICD-10-CM | POA: Diagnosis not present

## 2016-04-29 DIAGNOSIS — I495 Sick sinus syndrome: Secondary | ICD-10-CM | POA: Diagnosis not present

## 2016-04-29 DIAGNOSIS — M13852 Other specified arthritis, left hip: Secondary | ICD-10-CM | POA: Diagnosis not present

## 2016-04-29 DIAGNOSIS — M47815 Spondylosis without myelopathy or radiculopathy, thoracolumbar region: Secondary | ICD-10-CM | POA: Diagnosis not present

## 2016-04-29 DIAGNOSIS — I10 Essential (primary) hypertension: Secondary | ICD-10-CM | POA: Diagnosis not present

## 2016-04-29 DIAGNOSIS — M24852 Other specific joint derangements of left hip, not elsewhere classified: Secondary | ICD-10-CM | POA: Diagnosis not present

## 2016-04-29 DIAGNOSIS — Z7901 Long term (current) use of anticoagulants: Secondary | ICD-10-CM | POA: Diagnosis not present

## 2016-04-29 DIAGNOSIS — M24851 Other specific joint derangements of right hip, not elsewhere classified: Secondary | ICD-10-CM | POA: Diagnosis not present

## 2016-04-29 NOTE — Progress Notes (Signed)
PCP: Selinda FlavinHOWARD, KEVIN, MD  Adonis Bubba HalesM Latoya Crane is a 83 y.o. female who presents today for routine electrophysiology followup.  Since last being seen in our clinic, the patient reports doing reasonably well.  She has dementia.  She is now living at [facility]. Today, she denies symptoms of palpitations, chest pain, SOB, lower extremity edema, dizziness, presyncope, or syncope.   The patient is otherwise without complaint today.   Past Medical History:  Diagnosis Date  . Anxiety   . Atrial fibrillation (HCC)    CHADS2 score 4  . Coronary artery disease    Multivessel, BMS prox LAD 06/11,subsequent CABG, LVEF 65%  . Hyperlipidemia   . Hypertension   . Hypothyroidism   . Osteoarthritis   . Tachycardia-bradycardia syndrome (HCC)    s/p PPM  . TIA (transient ischemic attack)    Past Surgical History:  Procedure Laterality Date  . BREAST BIOPSY    . CATARACT EXTRACTION W/ INTRAOCULAR LENS IMPLANT    . CORONARY ARTERY BYPASS GRAFT  09/2009   Dr. Lorrin JacksonGerhardt,LIMA to LAD,SVG,to RCA  . PACEMAKER INSERTION     SJM implanted for tachy/brady syndrome  . TONSILLECTOMY    . TOTAL ABDOMINAL HYSTERECTOMY W/ BILATERAL SALPINGOOPHORECTOMY      Current Outpatient Prescriptions  Medication Sig Dispense Refill  . acetaminophen (TYLENOL) 500 MG tablet Take 500 mg by mouth every 4 (four) hours as needed.    . ALPRAZolam (XANAX) 0.25 MG tablet Take 0.025 mg by mouth as needed.   5  . aspirin 81 MG tablet Take 81 mg by mouth daily.    Marland Kitchen. diltiazem (CARDIZEM CD) 120 MG 24 hr capsule Take 120 mg by mouth 2 (two) times daily.      Marland Kitchen. docusate sodium (COLACE) 100 MG capsule Take 100 mg by mouth as needed.    . hydrochlorothiazide (HYDRODIURIL) 25 MG tablet TAKE 1/2 TABLET BY MOUTH DAILY. (MORNING) 45 tablet 0  . isosorbide mononitrate (IMDUR) 30 MG 24 hr tablet TAKE ONE TABLET BY MOUTH DAILY (MORNING)  5  . levothyroxine (SYNTHROID, LEVOTHROID) 50 MCG tablet Take 50 mcg by mouth daily.      . metoprolol tartrate  (LOPRESSOR) 25 MG tablet Take 12.5 mg by mouth 2 (two) times daily.      . Multiple Vitamins-Minerals (MULTIVITAMIN GUMMIES ADULT PO) Take 1 tablet by mouth 2 (two) times daily.    . Multiple Vitamins-Minerals (PRESERVISION AREDS 2 PO) Take 1 tablet by mouth 2 (two) times daily.    . nitroGLYCERIN (NITROSTAT) 0.4 MG SL tablet Place 0.4 mg under the tongue every 5 (five) minutes as needed.      . pantoprazole (PROTONIX) 40 MG tablet Take 40 mg by mouth daily.      . potassium chloride SA (K-DUR,KLOR-CON) 20 MEQ tablet Take 1 tablet by mouth Daily.    . QUEtiapine (SEROQUEL) 50 MG tablet Take 50 mg by mouth at bedtime.    Marland Kitchen. warfarin (COUMADIN) 2 MG tablet Take 2 mg by mouth daily. Per Dr. Jeannette HowHoward's office  Alternating 1/2 and whole pill each day.     No current facility-administered medications for this visit.     Physical Exam: Vitals:   04/29/16 1150  BP: (!) 95/57  Pulse: 72  Weight: 114 lb (51.7 kg)    GEN- The patient is well appearing, alert and oriented x 3 today.   Head- normocephalic, atraumatic Eyes-  Sclera clear, conjunctiva pink Ears- hearing intact Oropharynx- clear Lungs- Clear to ausculation bilaterally, normal work of breathing  Chest- pacemaker pocket is well healed Heart- Regular rate and rhythm, 2/6 SEM LUSB (early peaking) GI- soft, NT, ND, + BS Extremities- no clubbing, cyanosis, or edema  Pacemaker interrogation- reviewed in detail today,  See PACEART report  Assessment and Plan:  ATRIAL FIBRILLATION- persistent Stable, without symptoms Appears to be in afib all the time now.  Rates are controlled No change required today  Continue coumadin  Tachycardia-bradycardia syndrome  Normal pacemaker function See Pace Art report No changes today Rates are controlled Could consider reducing diltiazem in the future if needed  CAD Stable, without ischemic symptoms  HTN BP is low today Will stop hctz Could stop diltiazem in the future if needed as V rates  appear controlled   Return to the device clinic in 6 months in [place] I will see again in 1 year  Hillis Range MD, [facility] 04/29/2016 12:08 PM

## 2016-04-29 NOTE — Patient Instructions (Addendum)
Medication Instructions:   Stop HCTZ.  Continue all other medications.    Labwork: none  Testing/Procedures: none  Follow-Up: Your physician wants you to follow up in:  1 year.  You will receive a reminder letter in the mail one-two months in advance.  If you don't receive a letter, please call our office to schedule the follow up appointment - DR. ALLRED   Any Other Special Instructions Will Be Listed Below (If Applicable). Your physician wants you to follow up in: 6 months.  You will receive a reminder letter in the mail one-two months in advance.  If you don't receive a letter, please call our office to schedule the follow up appointment - DEVICE CLINIC   If you need a refill on your cardiac medications before your next appointment, please call your pharmacy.

## 2016-05-04 DIAGNOSIS — I482 Chronic atrial fibrillation: Secondary | ICD-10-CM | POA: Diagnosis not present

## 2016-05-04 DIAGNOSIS — M13852 Other specified arthritis, left hip: Secondary | ICD-10-CM | POA: Diagnosis not present

## 2016-05-04 DIAGNOSIS — M25562 Pain in left knee: Secondary | ICD-10-CM | POA: Diagnosis not present

## 2016-05-04 DIAGNOSIS — Z7901 Long term (current) use of anticoagulants: Secondary | ICD-10-CM | POA: Diagnosis not present

## 2016-05-04 DIAGNOSIS — M47815 Spondylosis without myelopathy or radiculopathy, thoracolumbar region: Secondary | ICD-10-CM | POA: Diagnosis not present

## 2016-05-04 DIAGNOSIS — M24851 Other specific joint derangements of right hip, not elsewhere classified: Secondary | ICD-10-CM | POA: Diagnosis not present

## 2016-05-04 DIAGNOSIS — M24852 Other specific joint derangements of left hip, not elsewhere classified: Secondary | ICD-10-CM | POA: Diagnosis not present

## 2016-05-04 DIAGNOSIS — M25552 Pain in left hip: Secondary | ICD-10-CM | POA: Diagnosis not present

## 2016-05-04 DIAGNOSIS — M25551 Pain in right hip: Secondary | ICD-10-CM | POA: Diagnosis not present

## 2016-05-04 DIAGNOSIS — R2681 Unsteadiness on feet: Secondary | ICD-10-CM | POA: Diagnosis not present

## 2016-05-04 DIAGNOSIS — Z7982 Long term (current) use of aspirin: Secondary | ICD-10-CM | POA: Diagnosis not present

## 2016-05-04 DIAGNOSIS — M25561 Pain in right knee: Secondary | ICD-10-CM | POA: Diagnosis not present

## 2016-05-04 LAB — CUP PACEART INCLINIC DEVICE CHECK
Battery Voltage: 2.74 V
Implantable Lead Implant Date: 20091022
Implantable Lead Location: 753859
Lead Channel Impedance Value: 343 Ohm
Lead Channel Impedance Value: 364 Ohm
Lead Channel Pacing Threshold Amplitude: 0.5 V
Lead Channel Pacing Threshold Pulse Width: 0.5 ms
Lead Channel Setting Pacing Amplitude: 2 V
Lead Channel Setting Pacing Amplitude: 2.5 V
Lead Channel Setting Sensing Sensitivity: 2 mV
MDC IDC LEAD IMPLANT DT: 20091022
MDC IDC LEAD LOCATION: 753860
MDC IDC MSMT BATTERY IMPEDANCE: 4100 Ohm
MDC IDC PG IMPLANT DT: 20091022
MDC IDC PG SERIAL: 2231870
MDC IDC SESS DTM: 20180202175830
MDC IDC SET LEADCHNL RV PACING PULSEWIDTH: 0.5 ms

## 2016-05-06 DIAGNOSIS — I482 Chronic atrial fibrillation: Secondary | ICD-10-CM | POA: Diagnosis not present

## 2016-05-06 DIAGNOSIS — Z7982 Long term (current) use of aspirin: Secondary | ICD-10-CM | POA: Diagnosis not present

## 2016-05-06 DIAGNOSIS — Z7901 Long term (current) use of anticoagulants: Secondary | ICD-10-CM | POA: Diagnosis not present

## 2016-05-06 DIAGNOSIS — M24851 Other specific joint derangements of right hip, not elsewhere classified: Secondary | ICD-10-CM | POA: Diagnosis not present

## 2016-05-06 DIAGNOSIS — M13852 Other specified arthritis, left hip: Secondary | ICD-10-CM | POA: Diagnosis not present

## 2016-05-06 DIAGNOSIS — M24852 Other specific joint derangements of left hip, not elsewhere classified: Secondary | ICD-10-CM | POA: Diagnosis not present

## 2016-05-06 DIAGNOSIS — M47815 Spondylosis without myelopathy or radiculopathy, thoracolumbar region: Secondary | ICD-10-CM | POA: Diagnosis not present

## 2016-05-09 DIAGNOSIS — M24851 Other specific joint derangements of right hip, not elsewhere classified: Secondary | ICD-10-CM | POA: Diagnosis not present

## 2016-05-09 DIAGNOSIS — I482 Chronic atrial fibrillation: Secondary | ICD-10-CM | POA: Diagnosis not present

## 2016-05-09 DIAGNOSIS — M47815 Spondylosis without myelopathy or radiculopathy, thoracolumbar region: Secondary | ICD-10-CM | POA: Diagnosis not present

## 2016-05-09 DIAGNOSIS — Z7982 Long term (current) use of aspirin: Secondary | ICD-10-CM | POA: Diagnosis not present

## 2016-05-09 DIAGNOSIS — M24852 Other specific joint derangements of left hip, not elsewhere classified: Secondary | ICD-10-CM | POA: Diagnosis not present

## 2016-05-09 DIAGNOSIS — M13852 Other specified arthritis, left hip: Secondary | ICD-10-CM | POA: Diagnosis not present

## 2016-05-09 DIAGNOSIS — Z7901 Long term (current) use of anticoagulants: Secondary | ICD-10-CM | POA: Diagnosis not present

## 2016-05-11 ENCOUNTER — Emergency Department (HOSPITAL_COMMUNITY)
Admission: EM | Admit: 2016-05-11 | Discharge: 2016-05-11 | Disposition: A | Discharge status: Home / Self Care | Payer: Medicare Other | Attending: Emergency Medicine | Admitting: Emergency Medicine

## 2016-05-11 ENCOUNTER — Encounter (HOSPITAL_COMMUNITY): Payer: Self-pay | Admitting: *Deleted

## 2016-05-11 ENCOUNTER — Emergency Department (HOSPITAL_COMMUNITY): Payer: Medicare Other

## 2016-05-11 DIAGNOSIS — S13150A Subluxation of C4/C5 cervical vertebrae, initial encounter: Secondary | ICD-10-CM | POA: Diagnosis not present

## 2016-05-11 DIAGNOSIS — W19XXXA Unspecified fall, initial encounter: Secondary | ICD-10-CM

## 2016-05-11 DIAGNOSIS — I10 Essential (primary) hypertension: Secondary | ICD-10-CM | POA: Insufficient documentation

## 2016-05-11 DIAGNOSIS — E039 Hypothyroidism, unspecified: Secondary | ICD-10-CM | POA: Insufficient documentation

## 2016-05-11 DIAGNOSIS — Y939 Activity, unspecified: Secondary | ICD-10-CM | POA: Diagnosis not present

## 2016-05-11 DIAGNOSIS — M542 Cervicalgia: Secondary | ICD-10-CM | POA: Diagnosis not present

## 2016-05-11 DIAGNOSIS — Z79899 Other long term (current) drug therapy: Secondary | ICD-10-CM | POA: Diagnosis not present

## 2016-05-11 DIAGNOSIS — I6782 Cerebral ischemia: Secondary | ICD-10-CM | POA: Diagnosis not present

## 2016-05-11 DIAGNOSIS — I2510 Atherosclerotic heart disease of native coronary artery without angina pectoris: Secondary | ICD-10-CM | POA: Insufficient documentation

## 2016-05-11 DIAGNOSIS — Z7982 Long term (current) use of aspirin: Secondary | ICD-10-CM | POA: Insufficient documentation

## 2016-05-11 DIAGNOSIS — Y999 Unspecified external cause status: Secondary | ICD-10-CM | POA: Insufficient documentation

## 2016-05-11 DIAGNOSIS — R279 Unspecified lack of coordination: Secondary | ICD-10-CM | POA: Diagnosis not present

## 2016-05-11 DIAGNOSIS — M532X2 Spinal instabilities, cervical region: Secondary | ICD-10-CM

## 2016-05-11 DIAGNOSIS — S199XXA Unspecified injury of neck, initial encounter: Secondary | ICD-10-CM | POA: Diagnosis not present

## 2016-05-11 DIAGNOSIS — S0990XA Unspecified injury of head, initial encounter: Secondary | ICD-10-CM | POA: Diagnosis not present

## 2016-05-11 DIAGNOSIS — Y92129 Unspecified place in nursing home as the place of occurrence of the external cause: Secondary | ICD-10-CM | POA: Insufficient documentation

## 2016-05-11 DIAGNOSIS — W06XXXA Fall from bed, initial encounter: Secondary | ICD-10-CM | POA: Diagnosis not present

## 2016-05-11 DIAGNOSIS — Z743 Need for continuous supervision: Secondary | ICD-10-CM | POA: Diagnosis not present

## 2016-05-11 DIAGNOSIS — M6281 Muscle weakness (generalized): Secondary | ICD-10-CM | POA: Diagnosis not present

## 2016-05-11 DIAGNOSIS — M25552 Pain in left hip: Secondary | ICD-10-CM | POA: Diagnosis not present

## 2016-05-11 DIAGNOSIS — R2689 Other abnormalities of gait and mobility: Secondary | ICD-10-CM | POA: Diagnosis not present

## 2016-05-11 DIAGNOSIS — R682 Dry mouth, unspecified: Secondary | ICD-10-CM | POA: Diagnosis present

## 2016-05-11 DIAGNOSIS — T148XXA Other injury of unspecified body region, initial encounter: Secondary | ICD-10-CM | POA: Diagnosis not present

## 2016-05-11 DIAGNOSIS — S79912A Unspecified injury of left hip, initial encounter: Secondary | ICD-10-CM | POA: Diagnosis not present

## 2016-05-11 MED ORDER — LORAZEPAM 2 MG/ML IJ SOLN
1.0000 mg | Freq: Once | INTRAMUSCULAR | Status: AC
  Administered 2016-05-11: 1 mg via INTRAMUSCULAR
  Filled 2016-05-11: qty 1

## 2016-05-11 NOTE — Discharge Instructions (Signed)
Her CT scan and xrays show a ligamentous injury to her neck. She needs to wear the aspen collar. Please call Dr Fredrich BirksStern's office to get an appointment to have him evaluate her injury. Return to the ED for any problems listed on the head injury sheet.

## 2016-05-11 NOTE — ED Triage Notes (Signed)
Pt brought in by [facility] for c/o fall out of bed, staff found pt in between bed and wall;

## 2016-05-11 NOTE — ED Notes (Signed)
[facility]

## 2016-05-11 NOTE — ED Notes (Signed)
Pt has removed her c collar on multiple times the patient refuses to wear the c collar

## 2016-05-11 NOTE — ED Provider Notes (Signed)
[facility]-EM[facility]ENCY DEPT Provider Note   CSN: [id number] Arrival date & time: 05/11/16  0128  Time seen 01:45 AM   History   Chief Complaint Chief Complaint  Patient presents with  . Fall    HPI Latoya Crane is a 83 y.o. female.  HPI  patient presents via EMS from her nursing home. She reports she fell out of bed but she doesn't know why. She states she doesn't know if she has any injuries. She is only complaining of a dry mouth.  PCP   Past Medical History:  Diagnosis Date  . Anxiety   . Atrial fibrillation (HCC)    CHADS2 score 4  . Coronary artery disease    Multivessel, BMS prox LAD 06/11,subsequent CABG, LVEF 65%  . Hyperlipidemia   . Hypertension   . Hypothyroidism   . Osteoarthritis   . Tachycardia-bradycardia syndrome (HCC)    s/p PPM  . TIA (transient ischemic attack)     Patient Active Problem List   Diagnosis Date Noted  . Moderate dementia with behavioral disturbance 03/17/2016  . Pacemaker-St.Jude 02/24/2012  . DYSLIPIDEMIA 08/31/2009  . HYPERTENSION, BENIGN 08/31/2009  . CHEST PAIN 08/31/2009  . Tachycardia-bradycardia syndrome (HCC) 01/08/2009  . CAD, NATIVE VESSEL 02/18/2008  . ATRIAL FIBRILLATION 02/18/2008    Past Surgical History:  Procedure Laterality Date  . BREAST BIOPSY    . CATARACT EXTRACTION W/ INTRAOCULAR LENS IMPLANT    . CORONARY ARTERY BYPASS GRAFT  09/2009   Dr. Lorrin Jackson to LAD,SVG,to RCA  . PACEMAKER INSERTION     SJM implanted for tachy/brady syndrome  . TONSILLECTOMY    . TOTAL ABDOMINAL HYSTERECTOMY W/ BILATERAL SALPINGOOPHORECTOMY      OB History    No data available       Home Medications    Prior to Admission medications   Medication Sig Start Date End Date Taking? Authorizing Provider  acetaminophen (TYLENOL) 500 MG tablet Take 500 mg by mouth every 4 (four) hours as needed.    Historical Provider, MD  ALPRAZolam Prudy Feeler) 0.25 MG tablet Take 0.025 mg by mouth as needed.  03/18/14   Historical  Provider, MD  aspirin 81 MG tablet Take 81 mg by mouth daily.    Historical Provider, MD  diltiazem (CARDIZEM CD) 120 MG 24 hr capsule Take 120 mg by mouth 2 (two) times daily.      Historical Provider, MD  docusate sodium (COLACE) 100 MG capsule Take 100 mg by mouth as needed.    Historical Provider, MD  isosorbide mononitrate (IMDUR) 30 MG 24 hr tablet TAKE ONE TABLET BY MOUTH DAILY (MORNING) 04/13/15   Historical Provider, MD  levothyroxine (SYNTHROID, LEVOTHROID) 50 MCG tablet Take 50 mcg by mouth daily.      Historical Provider, MD  metoprolol tartrate (LOPRESSOR) 25 MG tablet Take 12.5 mg by mouth 2 (two) times daily.      Historical Provider, MD  Multiple Vitamins-Minerals (MULTIVITAMIN GUMMIES ADULT PO) Take 1 tablet by mouth 2 (two) times daily.    Historical Provider, MD  Multiple Vitamins-Minerals (PRESERVISION AREDS 2 PO) Take 1 tablet by mouth 2 (two) times daily.    Historical Provider, MD  nitroGLYCERIN (NITROSTAT) 0.4 MG SL tablet Place 0.4 mg under the tongue every 5 (five) minutes as needed.      Historical Provider, MD  pantoprazole (PROTONIX) 40 MG tablet Take 40 mg by mouth daily.      Historical Provider, MD  potassium chloride SA (K-DUR,KLOR-CON) 20 MEQ tablet Take 1 tablet  by mouth Daily. 01/26/11   Historical Provider, MD  QUEtiapine (SEROQUEL) 50 MG tablet Take 50 mg by mouth at bedtime.    Historical Provider, MD  warfarin (COUMADIN) 2 MG tablet Take 2 mg by mouth daily. Per Dr. Jeannette How office  Alternating 1/2 and whole pill each day.    Historical Provider, MD    Family History Family History  Problem Relation Age of Onset  . Coronary artery disease Neg Hx     Social History Social History  Substance Use Topics  . Smoking status: Never Smoker  . Smokeless tobacco: Never Used  . Alcohol use No  pt is in NH   Allergies   Patient has no known allergies.   Review of Systems Review of Systems  Unable to perform ROS: Dementia     Physical Exam Updated  Vital Signs BP 143/89 (BP Location: Left Arm)   Pulse 79   Resp 18   Wt 114 lb (51.7 kg)   SpO2 98%   BMI 22.26 kg/m   Vital signs normal    Physical Exam  Constitutional: She is oriented to person, place, and time.  Non-toxic appearance. She does not appear ill. No distress.  Frail elderly female  HENT:  Head: Normocephalic and atraumatic.  Right Ear: External ear normal.  Left Ear: External ear normal.  Nose: Nose normal. No mucosal edema or rhinorrhea.  Mouth/Throat: Oropharynx is clear and moist and mucous membranes are normal. No dental abscesses or uvula swelling.  Eyes: Conjunctivae and EOM are normal. Pupils are equal, round, and reactive to light.  Neck: Full passive range of motion without pain.  C collar in place  Cardiovascular: Normal rate, regular rhythm and normal heart sounds.  Exam reveals no gallop and no friction rub.   No murmur heard. Pacemaker on the left  Pulmonary/Chest: Effort normal and breath sounds normal. No respiratory distress. She has no wheezes. She has no rhonchi. She has no rales. She exhibits no tenderness and no crepitus.  Abdominal: Soft. Normal appearance and bowel sounds are normal. She exhibits no distension. There is no tenderness. There is no rebound and no guarding.  Musculoskeletal: Normal range of motion. She exhibits no edema or tenderness.  Moves all extremities well, but has some mild pain in her left hip with ROM. No shortening or external rotation noted. She has no pain on range of motion of her upper extremities or her RLE.  Neurological: She is alert and oriented to person, place, and time. She has normal strength. No cranial nerve deficit.  Skin: Skin is warm, dry and intact. No rash noted. No erythema. No pallor.  Psychiatric: She has a normal mood and affect. Her speech is normal and behavior is normal. Her mood appears not anxious.  Nursing note and vitals reviewed.    ED Treatments / Results  Labs (all labs ordered are  listed, but only abnormal results are displayed) Labs Reviewed - No data to display  EKG  EKG Interpretation None       Radiology Ct Head Wo Contrast  Ct Cervical Spine Wo Contrast  Result Date: 05/11/2016 CLINICAL DATA:  Fall from bed.  History of dementia. EXAM: CT HEAD WITHOUT CONTRAST CT CERVICAL SPINE WITHOUT CONTRAST TECHNIQUE: Multidetector CT imaging of the head and cervical spine was performed following the standard protocol without intravenous contrast. Multiplanar CT image reconstructions of the cervical spine were also generated. COMPARISON:  CT head 01/22/2011, CTA neck 01/28/2011 FINDINGS: CT HEAD FINDINGS Brain: Diffuse cerebral  atrophy. Ventricular dilatation consistent with central atrophy. Low-attenuation changes in the deep white matter consistent with small vessel ischemia. No mass effect or midline shift. No abnormal extra-axial fluid collections. Gray-white matter junctions are distinct. Basal cisterns are not effaced. No acute intracranial hemorrhage. Vascular: Vascular calcifications in the carotid siphons. Skull: Calvarium appears intact. Sinuses/Orbits: Mucosal thickening throughout the paranasal sinuses. Mastoid air cells are not opacified. Other: No significant changes since previous study. Mild progression of atrophy in the interval. CT CERVICAL SPINE FINDINGS Alignment: Anterior subluxation of C3 on C4 and C4 on C5. This was present previously but there is mild progression since the previous study. Anterior subluxation of C5 on C6 is new since previous study. These changes are likely degenerative but given the presence of progression, acute ligamentous injury is not excluded. If there is clinical concern for ligamentous injury, MRI would be suggested for further evaluation. Loss of space between the odontoid process and anterior arch of C1 is likely degenerative and unchanged. C1-2 articulation is otherwise intact. Skull base and vertebrae: No acute fracture. No primary  bone lesion or focal pathologic process. Soft tissues and spinal canal: No prevertebral fluid or swelling. No visible canal hematoma. Disc levels: Degenerative changes throughout the cervical spine with narrowed interspaces and endplate hypertrophic changes throughout. Degenerative changes throughout the facet joints. Facet joint and uncovertebral joint spurring causes some bone encroachment upon neural foramina bilaterally. Upper chest: Interstitial changes in the lung apices. This may be due to edema or fibrosis. Other: Vascular calcifications in the cervical carotid arteries. IMPRESSION: No acute intracranial abnormalities. Chronic atrophy and small vessel ischemic changes. Diffuse degenerative changes throughout the cervical spine. Anterior subluxations at C3-4, C4-5, and C5-6 levels are likely degenerative but are progressing since previous study and therefore ligamentous injury is not excluded by this study. If there is clinical suspicion for ligamentous injury, MRI would be recommended. No acute displaced fractures are identified. Electronically Signed   By: Burman Nieves M.D.   On: 05/11/2016 02:49   Dg Cervical Spine Flex & Extend Only  Result Date: 05/11/2016 CLINICAL DATA:  Cervical spine subluxations on CT.  Dementia. EXAM: CERVICAL SPINE - FLEXION AND EXTENSION VIEWS ONLY COMPARISON:  CT cervical spine 05/11/2016. FINDINGS: Lateral flexion and extension views of the cervical spine are obtained. On flexion there is 6 mm anterior subluxation of C3 on C4, compared with 4 mm on extension. At C4-5, there is 6 mm anterior subluxation on flexion compared with 6 mm on extension. At C5-6, there is 3 mm anterior subluxation on flexion and 3 mm anterior subluxation on extension. Diffuse degenerative changes throughout the cervical spine. IMPRESSION: At C3-4, there is increased anterior subluxation on flexion in comparison to extension views. Ligamentous injury at this level is not excluded. Anterior  subluxation at C4-5 and C5-6 levels appear stable through flexion and extension. Electronically Signed   By: Burman Nieves M.D.   On: 05/11/2016 04:35   Dg Hip Unilat With Pelvis 2-3 Views Left  Result Date: 05/11/2016 CLINICAL DATA:  Left-sided hip pain after a fall. EXAM: DG HIP (WITH OR WITHOUT PELVIS) 2-3V LEFT COMPARISON:  04/21/2016 FINDINGS: Pelvis and left hip appear intact. No evidence of acute fracture or dislocation. No focal bone lesion or bone destruction. Mild degenerative changes in the lower lumbar spine and both hips. SI joints and symphysis pubis are not displaced. Vascular calcifications. No change since previous study. IMPRESSION: No acute bony abnormalities. Electronically Signed   By: Marisa Cyphers.D.  On: 05/11/2016 03:01    Procedures Procedures (including critical care time)  Medications Ordered in ED Medications  LORazepam (ATIVAN) injection 1 mg (1 mg Intramuscular Given 05/11/16 0351)     Initial Impression / Assessment and Plan / ED Course  I have reviewed the triage vital signs and the nursing notes.  Pertinent labs & imaging results that were available during my care of the patient were reviewed by me and considered in my medical decision making (see chart for details).  After reviewing patient's CT scan of her neck, I verified that she still has a pacemaker and I can feel it in her left chest. Patient will not be able to have MRI. Flexion-extension views were done of her cervical spine. Pt was given IM ativan to help her cooperate for her xray.   After reviewing the results of her flexion-extension views patient was placed in an Aspen collar. She was referred to Dr. Venetia MaxonStern to be evaluated.  Final Clinical Impressions(s) / ED Diagnoses   Final diagnoses:  Fall at nursing home, initial encounter  Cervical spine instability   Plan discharge  Devoria AlbeIva Murial Beam, MD, [facility] PyoFACEP     Silvia Hightower, MD 05/11/16 [phone]

## 2016-05-12 DIAGNOSIS — Z7901 Long term (current) use of anticoagulants: Secondary | ICD-10-CM | POA: Diagnosis not present

## 2016-05-12 DIAGNOSIS — I482 Chronic atrial fibrillation: Secondary | ICD-10-CM | POA: Diagnosis not present

## 2016-05-12 DIAGNOSIS — M47815 Spondylosis without myelopathy or radiculopathy, thoracolumbar region: Secondary | ICD-10-CM | POA: Diagnosis not present

## 2016-05-12 DIAGNOSIS — M24852 Other specific joint derangements of left hip, not elsewhere classified: Secondary | ICD-10-CM | POA: Diagnosis not present

## 2016-05-12 DIAGNOSIS — Z7982 Long term (current) use of aspirin: Secondary | ICD-10-CM | POA: Diagnosis not present

## 2016-05-12 DIAGNOSIS — M24851 Other specific joint derangements of right hip, not elsewhere classified: Secondary | ICD-10-CM | POA: Diagnosis not present

## 2016-05-12 DIAGNOSIS — M13852 Other specified arthritis, left hip: Secondary | ICD-10-CM | POA: Diagnosis not present

## 2016-05-16 DIAGNOSIS — M24852 Other specific joint derangements of left hip, not elsewhere classified: Secondary | ICD-10-CM | POA: Diagnosis not present

## 2016-05-16 DIAGNOSIS — I482 Chronic atrial fibrillation: Secondary | ICD-10-CM | POA: Diagnosis not present

## 2016-05-16 DIAGNOSIS — M13852 Other specified arthritis, left hip: Secondary | ICD-10-CM | POA: Diagnosis not present

## 2016-05-16 DIAGNOSIS — Z7982 Long term (current) use of aspirin: Secondary | ICD-10-CM | POA: Diagnosis not present

## 2016-05-16 DIAGNOSIS — M47815 Spondylosis without myelopathy or radiculopathy, thoracolumbar region: Secondary | ICD-10-CM | POA: Diagnosis not present

## 2016-05-16 DIAGNOSIS — M24851 Other specific joint derangements of right hip, not elsewhere classified: Secondary | ICD-10-CM | POA: Diagnosis not present

## 2016-05-16 DIAGNOSIS — Z7901 Long term (current) use of anticoagulants: Secondary | ICD-10-CM | POA: Diagnosis not present

## 2016-05-17 DIAGNOSIS — H353211 Exudative age-related macular degeneration, right eye, with active choroidal neovascularization: Secondary | ICD-10-CM | POA: Diagnosis not present

## 2016-05-18 DIAGNOSIS — R600 Localized edema: Secondary | ICD-10-CM | POA: Diagnosis not present

## 2016-05-19 DIAGNOSIS — M47815 Spondylosis without myelopathy or radiculopathy, thoracolumbar region: Secondary | ICD-10-CM | POA: Diagnosis not present

## 2016-05-19 DIAGNOSIS — Z7982 Long term (current) use of aspirin: Secondary | ICD-10-CM | POA: Diagnosis not present

## 2016-05-19 DIAGNOSIS — M24852 Other specific joint derangements of left hip, not elsewhere classified: Secondary | ICD-10-CM | POA: Diagnosis not present

## 2016-05-19 DIAGNOSIS — E782 Mixed hyperlipidemia: Secondary | ICD-10-CM | POA: Diagnosis not present

## 2016-05-19 DIAGNOSIS — I10 Essential (primary) hypertension: Secondary | ICD-10-CM | POA: Diagnosis not present

## 2016-05-19 DIAGNOSIS — E119 Type 2 diabetes mellitus without complications: Secondary | ICD-10-CM | POA: Diagnosis not present

## 2016-05-19 DIAGNOSIS — M24851 Other specific joint derangements of right hip, not elsewhere classified: Secondary | ICD-10-CM | POA: Diagnosis not present

## 2016-05-19 DIAGNOSIS — Z7901 Long term (current) use of anticoagulants: Secondary | ICD-10-CM | POA: Diagnosis not present

## 2016-05-19 DIAGNOSIS — I482 Chronic atrial fibrillation: Secondary | ICD-10-CM | POA: Diagnosis not present

## 2016-05-19 DIAGNOSIS — M13852 Other specified arthritis, left hip: Secondary | ICD-10-CM | POA: Diagnosis not present

## 2016-05-19 DIAGNOSIS — I4891 Unspecified atrial fibrillation: Secondary | ICD-10-CM | POA: Diagnosis not present

## 2016-05-19 DIAGNOSIS — E039 Hypothyroidism, unspecified: Secondary | ICD-10-CM | POA: Diagnosis not present

## 2016-05-23 DIAGNOSIS — M25562 Pain in left knee: Secondary | ICD-10-CM | POA: Diagnosis not present

## 2016-05-23 DIAGNOSIS — M25561 Pain in right knee: Secondary | ICD-10-CM | POA: Diagnosis not present

## 2016-05-25 DIAGNOSIS — R600 Localized edema: Secondary | ICD-10-CM | POA: Diagnosis not present

## 2016-05-25 DIAGNOSIS — G894 Chronic pain syndrome: Secondary | ICD-10-CM | POA: Diagnosis not present

## 2016-06-01 DIAGNOSIS — M25561 Pain in right knee: Secondary | ICD-10-CM | POA: Diagnosis not present

## 2016-06-01 DIAGNOSIS — I482 Chronic atrial fibrillation: Secondary | ICD-10-CM | POA: Diagnosis not present

## 2016-06-01 DIAGNOSIS — R600 Localized edema: Secondary | ICD-10-CM | POA: Diagnosis not present

## 2016-06-01 DIAGNOSIS — I10 Essential (primary) hypertension: Secondary | ICD-10-CM | POA: Diagnosis not present

## 2016-06-01 DIAGNOSIS — E039 Hypothyroidism, unspecified: Secondary | ICD-10-CM | POA: Diagnosis not present

## 2016-06-10 ENCOUNTER — Ambulatory Visit: Payer: Self-pay | Admitting: Neurology

## 2016-06-20 DIAGNOSIS — M25561 Pain in right knee: Secondary | ICD-10-CM | POA: Diagnosis not present

## 2016-06-20 DIAGNOSIS — E039 Hypothyroidism, unspecified: Secondary | ICD-10-CM | POA: Diagnosis not present

## 2016-06-20 DIAGNOSIS — R600 Localized edema: Secondary | ICD-10-CM | POA: Diagnosis not present

## 2016-06-20 DIAGNOSIS — I482 Chronic atrial fibrillation: Secondary | ICD-10-CM | POA: Diagnosis not present

## 2016-06-20 DIAGNOSIS — I10 Essential (primary) hypertension: Secondary | ICD-10-CM | POA: Diagnosis not present

## 2016-06-21 DIAGNOSIS — E1151 Type 2 diabetes mellitus with diabetic peripheral angiopathy without gangrene: Secondary | ICD-10-CM | POA: Diagnosis not present

## 2016-06-22 DIAGNOSIS — G894 Chronic pain syndrome: Secondary | ICD-10-CM | POA: Diagnosis not present

## 2016-06-28 DIAGNOSIS — H35722 Serous detachment of retinal pigment epithelium, left eye: Secondary | ICD-10-CM | POA: Diagnosis not present

## 2016-06-28 DIAGNOSIS — H353211 Exudative age-related macular degeneration, right eye, with active choroidal neovascularization: Secondary | ICD-10-CM | POA: Diagnosis not present

## 2016-06-29 DIAGNOSIS — I482 Chronic atrial fibrillation: Secondary | ICD-10-CM | POA: Diagnosis not present

## 2016-06-29 DIAGNOSIS — K219 Gastro-esophageal reflux disease without esophagitis: Secondary | ICD-10-CM | POA: Diagnosis not present

## 2016-06-29 DIAGNOSIS — R600 Localized edema: Secondary | ICD-10-CM | POA: Diagnosis not present

## 2016-06-29 DIAGNOSIS — I10 Essential (primary) hypertension: Secondary | ICD-10-CM | POA: Diagnosis not present

## 2016-06-29 DIAGNOSIS — E876 Hypokalemia: Secondary | ICD-10-CM | POA: Diagnosis not present

## 2016-07-06 DIAGNOSIS — M25551 Pain in right hip: Secondary | ICD-10-CM | POA: Diagnosis not present

## 2016-07-06 DIAGNOSIS — G894 Chronic pain syndrome: Secondary | ICD-10-CM | POA: Diagnosis not present

## 2016-07-06 DIAGNOSIS — M25552 Pain in left hip: Secondary | ICD-10-CM | POA: Diagnosis not present

## 2016-07-07 DIAGNOSIS — Z79899 Other long term (current) drug therapy: Secondary | ICD-10-CM | POA: Diagnosis not present

## 2016-07-13 DIAGNOSIS — I482 Chronic atrial fibrillation: Secondary | ICD-10-CM | POA: Diagnosis not present

## 2016-07-20 DIAGNOSIS — G894 Chronic pain syndrome: Secondary | ICD-10-CM | POA: Diagnosis not present

## 2016-07-20 DIAGNOSIS — E876 Hypokalemia: Secondary | ICD-10-CM | POA: Diagnosis not present

## 2016-07-20 DIAGNOSIS — E039 Hypothyroidism, unspecified: Secondary | ICD-10-CM | POA: Diagnosis not present

## 2016-07-20 DIAGNOSIS — G309 Alzheimer's disease, unspecified: Secondary | ICD-10-CM | POA: Diagnosis not present

## 2016-07-20 DIAGNOSIS — I482 Chronic atrial fibrillation: Secondary | ICD-10-CM | POA: Diagnosis not present

## 2016-07-25 ENCOUNTER — Emergency Department (HOSPITAL_COMMUNITY)
Admission: EM | Admit: 2016-07-25 | Discharge: 2016-07-26 | Disposition: A | Discharge status: Skilled Nursing Facility | Payer: Medicare Other | Attending: Emergency Medicine | Admitting: Emergency Medicine

## 2016-07-25 ENCOUNTER — Encounter (HOSPITAL_COMMUNITY): Payer: Self-pay | Admitting: Emergency Medicine

## 2016-07-25 DIAGNOSIS — Y92002 Bathroom of unspecified non-institutional (private) residence single-family (private) house as the place of occurrence of the external cause: Secondary | ICD-10-CM | POA: Insufficient documentation

## 2016-07-25 DIAGNOSIS — Z79899 Other long term (current) drug therapy: Secondary | ICD-10-CM | POA: Insufficient documentation

## 2016-07-25 DIAGNOSIS — E039 Hypothyroidism, unspecified: Secondary | ICD-10-CM | POA: Insufficient documentation

## 2016-07-25 DIAGNOSIS — Y939 Activity, unspecified: Secondary | ICD-10-CM | POA: Insufficient documentation

## 2016-07-25 DIAGNOSIS — S01312A Laceration without foreign body of left ear, initial encounter: Secondary | ICD-10-CM | POA: Diagnosis not present

## 2016-07-25 DIAGNOSIS — Z7982 Long term (current) use of aspirin: Secondary | ICD-10-CM | POA: Insufficient documentation

## 2016-07-25 DIAGNOSIS — S0191XA Laceration without foreign body of unspecified part of head, initial encounter: Secondary | ICD-10-CM | POA: Diagnosis not present

## 2016-07-25 DIAGNOSIS — S0990XA Unspecified injury of head, initial encounter: Secondary | ICD-10-CM | POA: Diagnosis present

## 2016-07-25 DIAGNOSIS — W19XXXA Unspecified fall, initial encounter: Secondary | ICD-10-CM | POA: Diagnosis not present

## 2016-07-25 DIAGNOSIS — S1191XA Laceration without foreign body of unspecified part of neck, initial encounter: Secondary | ICD-10-CM | POA: Diagnosis not present

## 2016-07-25 DIAGNOSIS — I2510 Atherosclerotic heart disease of native coronary artery without angina pectoris: Secondary | ICD-10-CM | POA: Diagnosis not present

## 2016-07-25 DIAGNOSIS — R531 Weakness: Secondary | ICD-10-CM | POA: Diagnosis not present

## 2016-07-25 DIAGNOSIS — Y999 Unspecified external cause status: Secondary | ICD-10-CM | POA: Diagnosis not present

## 2016-07-25 DIAGNOSIS — H9209 Otalgia, unspecified ear: Secondary | ICD-10-CM | POA: Diagnosis not present

## 2016-07-25 DIAGNOSIS — I10 Essential (primary) hypertension: Secondary | ICD-10-CM | POA: Diagnosis not present

## 2016-07-25 DIAGNOSIS — R404 Transient alteration of awareness: Secondary | ICD-10-CM | POA: Diagnosis not present

## 2016-07-25 NOTE — ED Triage Notes (Signed)
Pt has laceration behind L ear. Staff from [facility] unsure what happened. No bleeding noted at this time.

## 2016-07-26 ENCOUNTER — Emergency Department (HOSPITAL_COMMUNITY): Payer: Medicare Other

## 2016-07-26 DIAGNOSIS — I482 Chronic atrial fibrillation: Secondary | ICD-10-CM | POA: Diagnosis not present

## 2016-07-26 DIAGNOSIS — S1191XA Laceration without foreign body of unspecified part of neck, initial encounter: Secondary | ICD-10-CM | POA: Diagnosis not present

## 2016-07-26 DIAGNOSIS — S0191XA Laceration without foreign body of unspecified part of head, initial encounter: Secondary | ICD-10-CM | POA: Diagnosis not present

## 2016-07-26 NOTE — ED Provider Notes (Signed)
[facility]-EMERGENCY DEPT Provider Note   CSN: [id number] Arrival date & time: 07/25/16  2345  By signing my name below, I, Nelwyn Salisbury, attest that this documentation has been prepared under the direction and in the presence of Devoria Albe, MD . Electronically Signed: Nelwyn Salisbury, Scribe. 07/26/2016. 12:07 AM.  Time Seen:  0010  History   Chief Complaint Chief Complaint  Patient presents with  . Ear Laceration   The history is provided by the patient. No language interpreter was used.    LEVEL 5 CAVEAT DUE TO PATIENT DEMENTIA  HPI Comments:  Latoya Crane is a 83 y.o. female from [facility] with pmhx of dementia who presents to the Emergency Department complaining of constant, mild left-ear pain onset today. Per EMS, pt was not found on the floor but instead the laceration was noticed by staff earlier tonight. Pt does not know what happened. She only c/o left ear pain.  PCP Criss Rosales, MD   Patient is DO NOT RESUSCITATE   Past Medical History:  Diagnosis Date  . Anxiety   . Atrial fibrillation (HCC)    CHADS2 score 4  . Coronary artery disease    Multivessel, BMS prox LAD 06/11,subsequent CABG, LVEF 65%  . Hyperlipidemia   . Hypertension   . Hypothyroidism   . Osteoarthritis   . Tachycardia-bradycardia syndrome (HCC)    s/p PPM  . TIA (transient ischemic attack)     Patient Active Problem List   Diagnosis Date Noted  . Moderate dementia with behavioral disturbance 03/17/2016  . Pacemaker-St.Jude 02/24/2012  . DYSLIPIDEMIA 08/31/2009  . HYPERTENSION, BENIGN 08/31/2009  . CHEST PAIN 08/31/2009  . Tachycardia-bradycardia syndrome (HCC) 01/08/2009  . CAD, NATIVE VESSEL 02/18/2008  . ATRIAL FIBRILLATION 02/18/2008    Past Surgical History:  Procedure Laterality Date  . BREAST BIOPSY    . CATARACT EXTRACTION W/ INTRAOCULAR LENS IMPLANT    . CORONARY ARTERY BYPASS GRAFT  09/2009   Dr. Lorrin Jackson to LAD,SVG,to RCA  . PACEMAKER INSERTION     SJM implanted for tachy/brady syndrome  . TONSILLECTOMY    . TOTAL ABDOMINAL HYSTERECTOMY W/ BILATERAL SALPINGOOPHORECTOMY      OB History    No data available       Home Medications    Prior to Admission medications   Medication Sig Start Date End Date Taking? Authorizing Provider  ALPRAZolam (XANAX) 0.25 MG tablet Take 0.025 mg by mouth as needed.  03/18/14  Yes Historical Provider, MD  diltiazem (CARDIZEM CD) 120 MG 24 hr capsule Take 120 mg by mouth 2 (two) times daily.     Yes Historical Provider, MD  docusate sodium (COLACE) 100 MG capsule Take 100 mg by mouth as needed.   Yes Historical Provider, MD  furosemide (LASIX) 20 MG tablet Take 20 mg by mouth daily.   Yes Historical Provider, MD  HYDROcodone-acetaminophen (NORCO/VICODIN) 5-325 MG tablet Take 1 tablet by mouth every 6 (six) hours as needed for moderate pain.   Yes Historical Provider, MD  isosorbide mononitrate (IMDUR) 30 MG 24 hr tablet TAKE ONE TABLET BY MOUTH DAILY (MORNING) 04/13/15  Yes Historical Provider, MD  levothyroxine (SYNTHROID, LEVOTHROID) 50 MCG tablet Take 50 mcg by mouth daily.     Yes Historical Provider, MD  metoprolol tartrate (LOPRESSOR) 25 MG tablet Take 12.5 mg by mouth 2 (two) times daily.     Yes Historical Provider, MD  Multiple Vitamins-Minerals (PRESERVISION AREDS 2 PO) Take 1 tablet by mouth 2 (two) times daily.  Yes Historical Provider, MD  OLANZapine (ZYPREXA) 5 MG tablet Take 5 mg by mouth at bedtime.   Yes Historical Provider, MD  pantoprazole (PROTONIX) 40 MG tablet Take 40 mg by mouth daily.     Yes Historical Provider, MD  potassium chloride SA (K-DUR,KLOR-CON) 20 MEQ tablet Take 1 tablet by mouth Daily. 01/26/11  Yes Historical Provider, MD  sertraline (ZOLOFT) 25 MG tablet Take 25 mg by mouth daily.   Yes Historical Provider, MD  warfarin (COUMADIN) 2 MG tablet Take 2 mg by mouth daily. Per Dr. Jeannette How office  Alternating 1/2 and whole pill each day.   Yes Historical Provider, MD    acetaminophen (TYLENOL) 500 MG tablet Take 500 mg by mouth every 4 (four) hours as needed.    Historical Provider, MD  aspirin 81 MG tablet Take 81 mg by mouth daily.    Historical Provider, MD  Multiple Vitamins-Minerals (MULTIVITAMIN GUMMIES ADULT PO) Take 1 tablet by mouth 2 (two) times daily.    Historical Provider, MD  nitroGLYCERIN (NITROSTAT) 0.4 MG SL tablet Place 0.4 mg under the tongue every 5 (five) minutes as needed.      Historical Provider, MD  QUEtiapine (SEROQUEL) 50 MG tablet Take 50 mg by mouth at bedtime.    Historical Provider, MD    Family History Family History  Problem Relation Age of Onset  . Coronary artery disease Neg Hx     Social History Social History  Substance Use Topics  . Smoking status: Never Smoker  . Smokeless tobacco: Never Used  . Alcohol use No  lives in NH   Allergies   Patient has no known allergies.   Review of Systems Review of Systems  Unable to perform ROS: Dementia     Physical Exam Updated Vital Signs BP (!) 129/108 Comment: Pt lying on side when BP was taken  Pulse (!) 112   Resp 17   SpO2 96%   Vital signs normal except for tachycardia and diastolic hypertension   Physical Exam  Constitutional:  Non-toxic appearance. She does not appear ill. No distress.  Pt groaning in pain at times holding her ear, frail elderly female, asking for something to drink.   HENT:  Head: Normocephalic and atraumatic.  Right Ear: External ear normal.  Left Ear: External ear normal.  Nose: Nose normal. No mucosal edema or rhinorrhea.  Mouth/Throat: Mucous membranes are normal. No dental abscesses or uvula swelling.  Eyes: Conjunctivae and EOM are normal. Pupils are equal, round, and reactive to light.  Neck: Normal range of motion and full passive range of motion without pain. Neck supple.  Cardiovascular: Regular rhythm and normal heart sounds.  Exam reveals no gallop and no friction rub.   No murmur heard. Pulmonary/Chest: Effort  normal and breath sounds normal. No respiratory distress. She has no wheezes. She has no rhonchi. She has no rales. She exhibits no tenderness and no crepitus.  Abdominal: Soft. Normal appearance and bowel sounds are normal. She exhibits no distension. There is no tenderness. There is no rebound and no guarding.  Musculoskeletal: Normal range of motion. She exhibits no edema or tenderness.  Neurological: She is alert. She has normal strength. No cranial nerve deficit.  Skin: Skin is warm, dry and intact. No rash noted. No erythema. No pallor.  2.5 linear, vertically oriented laceration behind left ear. No fresh bleeding. Appears olader.   Psychiatric: She has a normal mood and affect. Her speech is normal and behavior is normal. Her mood appears  not anxious.  Nursing note and vitals reviewed.          ED Treatments / Results  Labs (all labs ordered are listed, but only abnormal results are displayed) Labs Reviewed - No data to display  EKG  EKG Interpretation None       Radiology Ct Head Wo Contrast  Ct Cervical Spine Wo Contrast  Result Date: 07/26/2016 CLINICAL DATA:  Nursing home patient with dementia  with laceration EXAM: CT HEAD WITHOUT CONTRAST CT CERVICAL SPINE WITHOUT CONTRAST TECHNIQUE: Multidetector CT imaging of the head and cervical spine was performed following the standard protocol without intravenous contrast. Multiplanar CT image reconstructions of the cervical spine were also generated. COMPARISON:  05/11/2016 FINDINGS: CT HEAD FINDINGS Brain: No acute territorial infarction, hemorrhage or intracranial mass is seen. Moderate atrophy. Mild to moderate periventricular and deep white matter small vessel ischemic changes. Old appearing lacunar infarcts in the basal ganglia. Ventricle size is stable. Vascular: No hyperdense vessels.  Carotid artery calcifications. Skull: Normal. Negative for fracture or focal lesion. Sinuses/Orbits: Mucosal thickening in the ethmoid and  maxillary sinuses. No acute orbital abnormality Other: None CT CERVICAL SPINE FINDINGS Alignment: Similar compared to most recent CT with 3 mm anterior listhesis of C3 on C4, 4 mm anterior listhesis of C4 on C5 and 2 mm anterior listhesis of C5 on C6. Facet alignment is similar compared to the previous exam. Skull base and vertebrae: Craniovertebral junction appears intact. There is no fracture Soft tissues and spinal canal: No prevertebral fluid or swelling. No visible canal hematoma. Disc levels: Moderate severe degenerative disc changes at C4-C5, C5-C6 and C6-C7. Multilevel bilateral hypertrophic facet arthropathy results in multilevel bilateral foraminal stenosis. Upper chest: The lung apices demonstrate scarring. No evidence for a thyroid mass. Carotid artery calcifications. Other: None IMPRESSION: 1. No definite CT evidence for acute intracranial abnormality. Small vessel ischemic changes of the white matter 2. Similar alignment of the cervical spine with multilevel anterolisthesis from C4 through C7 ; no fracture is seen. Electronically Signed   By: Jasmine Pang M.D.   On: 07/26/2016 02:10    Procedures Procedures (including critical care time)  Medications Ordered in ED Medications - No data to display   Initial Impression / Assessment and Plan / ED Course  I have reviewed the triage vital signs and the nursing notes.  Pertinent labs & imaging results that were available during my care of the patient were reviewed by me and considered in my medical decision making (see chart for details).  DIAGNOSTIC STUDIES:    COORDINATION OF CARE:  12:13 AM Verbalized treatment plan to pt at bedside which includes head CTAnd cervical spine CT.  My PA, Pauline Aus, Steri-Stripped her wound. The wound is well approximated when her ears in natural position..     Final Clinical Impressions(s) / ED Diagnoses   Final diagnoses:  Laceration of antihelix of left ear, initial encounter    Plan  discharge  Devoria Albe, MD, FACEP   I personally performed the services described in this documentation, which was scribed in my presence. The recorded information has been reviewed and considered.  Devoria Albe, MD, Concha Pyo, MD 07/26/16 [phone]

## 2016-07-26 NOTE — ED Notes (Signed)
Patient has dementia, patient is calling out for water at this time.

## 2016-07-26 NOTE — ED Notes (Signed)
Pt assisted to bathroom via WC- nurse stayed with pt in bathroom as pt is high fall risk. Pt now at nurses station sitting in [facility]. Pt given warm blanket.

## 2016-07-26 NOTE — ED Notes (Signed)
Pt sitting up in bed again, confused and wanting to get up, this nurse to bedside, talked with patient, and repositioned in bed, pt redirected and reassurance given.

## 2016-07-26 NOTE — Discharge Instructions (Signed)
Keep the laceration dry, the Steri-Strips will fall off in about a week. At that point the wound should be healed. Ice packs will help for comfort and with the bruising. She should be rechecked for any worrisome symptoms listed on the head injury sheet or if the wound appears to be getting infected, increased swelling, redness, drainage of pus, fever.

## 2016-07-26 NOTE — ED Notes (Signed)
Patient transported to CT 

## 2016-07-26 NOTE — ED Notes (Signed)
Patient taken to bathroom via wheelchair with 2 person assist. Patient in wheelchair at desk with staff due to patient trying to get out of bed by herself. Patient gait is unsteady and patient not able to stand without assistance. High fall risk.

## 2016-07-26 NOTE — ED Notes (Signed)
ED Provider at bedside. 

## 2016-07-26 NOTE — ED Provider Notes (Signed)
   Pt with laceration to posterior helix of the left ear.  Shared visit.  My only involvement in the patient's care was repair of the laceration.     LACERATION REPAIR Performed by: Shanielle Correll L. Authorized by: Maxwell Caul Consent: Verbal consent obtained. Risks and benefits: risks, benefits and alternatives were discussed Consent given by: patient Patient identity confirmed: provided demographic data Prepped and Draped in normal sterile fashion Wound explored  Laceration Location: posterior left ear  Laceration Length: 2 cm  No Foreign Bodies seen or palpated  Anesthesia: none  Irrigation method: saline and gauze Amount of cleaning: standard  Skin closure: steri-strips  Number of steri-strips:  3  Technique: topical application.  Patient tolerance: Patient tolerated the procedure well with no immediate complications.    Pauline Aus, PA-C 07/26/16 1610    Devoria Albe, MD 07/26/16 [phone]

## 2016-07-26 NOTE — ED Notes (Addendum)
Pt sitting up in bed, stating she wants to get up- nurse to room- reassurance given, pt repositioned and covers placed over pt. Pt asking for water- informed that Dr Lynelle Doctor has said not until CT scans resulted. Pt is in direct view of nurses station.

## 2016-07-27 DIAGNOSIS — E039 Hypothyroidism, unspecified: Secondary | ICD-10-CM | POA: Diagnosis not present

## 2016-07-27 DIAGNOSIS — R2681 Unsteadiness on feet: Secondary | ICD-10-CM | POA: Diagnosis not present

## 2016-07-27 DIAGNOSIS — E876 Hypokalemia: Secondary | ICD-10-CM | POA: Diagnosis not present

## 2016-07-27 DIAGNOSIS — K219 Gastro-esophageal reflux disease without esophagitis: Secondary | ICD-10-CM | POA: Diagnosis not present

## 2016-07-27 DIAGNOSIS — I482 Chronic atrial fibrillation: Secondary | ICD-10-CM | POA: Diagnosis not present

## 2016-08-03 DIAGNOSIS — K219 Gastro-esophageal reflux disease without esophagitis: Secondary | ICD-10-CM | POA: Diagnosis not present

## 2016-08-15 ENCOUNTER — Emergency Department (HOSPITAL_COMMUNITY): Payer: Medicare Other

## 2016-08-15 ENCOUNTER — Encounter (HOSPITAL_COMMUNITY): Payer: Self-pay | Admitting: Emergency Medicine

## 2016-08-15 ENCOUNTER — Emergency Department (HOSPITAL_COMMUNITY)
Admission: EM | Admit: 2016-08-15 | Discharge: 2016-08-15 | Disposition: A | Discharge status: Home / Self Care | Payer: Medicare Other | Attending: Emergency Medicine | Admitting: Emergency Medicine

## 2016-08-15 DIAGNOSIS — S7002XA Contusion of left hip, initial encounter: Secondary | ICD-10-CM | POA: Diagnosis not present

## 2016-08-15 DIAGNOSIS — Y998 Other external cause status: Secondary | ICD-10-CM | POA: Diagnosis not present

## 2016-08-15 DIAGNOSIS — R52 Pain, unspecified: Secondary | ICD-10-CM

## 2016-08-15 DIAGNOSIS — I10 Essential (primary) hypertension: Secondary | ICD-10-CM | POA: Diagnosis not present

## 2016-08-15 DIAGNOSIS — F0391 Unspecified dementia with behavioral disturbance: Secondary | ICD-10-CM

## 2016-08-15 DIAGNOSIS — Y9301 Activity, walking, marching and hiking: Secondary | ICD-10-CM | POA: Diagnosis not present

## 2016-08-15 DIAGNOSIS — W19XXXA Unspecified fall, initial encounter: Secondary | ICD-10-CM | POA: Diagnosis not present

## 2016-08-15 DIAGNOSIS — S60221A Contusion of right hand, initial encounter: Secondary | ICD-10-CM | POA: Insufficient documentation

## 2016-08-15 DIAGNOSIS — E039 Hypothyroidism, unspecified: Secondary | ICD-10-CM | POA: Diagnosis not present

## 2016-08-15 DIAGNOSIS — I480 Paroxysmal atrial fibrillation: Secondary | ICD-10-CM | POA: Diagnosis not present

## 2016-08-15 DIAGNOSIS — S60222A Contusion of left hand, initial encounter: Secondary | ICD-10-CM | POA: Diagnosis not present

## 2016-08-15 DIAGNOSIS — F03B18 Unspecified dementia, moderate, with other behavioral disturbance: Secondary | ICD-10-CM | POA: Diagnosis present

## 2016-08-15 DIAGNOSIS — I4891 Unspecified atrial fibrillation: Secondary | ICD-10-CM | POA: Diagnosis present

## 2016-08-15 DIAGNOSIS — N179 Acute kidney failure, unspecified: Secondary | ICD-10-CM

## 2016-08-15 DIAGNOSIS — S79912A Unspecified injury of left hip, initial encounter: Secondary | ICD-10-CM | POA: Diagnosis not present

## 2016-08-15 DIAGNOSIS — I2510 Atherosclerotic heart disease of native coronary artery without angina pectoris: Secondary | ICD-10-CM | POA: Insufficient documentation

## 2016-08-15 DIAGNOSIS — N289 Disorder of kidney and ureter, unspecified: Secondary | ICD-10-CM | POA: Diagnosis not present

## 2016-08-15 DIAGNOSIS — M25522 Pain in left elbow: Secondary | ICD-10-CM | POA: Diagnosis not present

## 2016-08-15 DIAGNOSIS — Z7901 Long term (current) use of anticoagulants: Secondary | ICD-10-CM

## 2016-08-15 DIAGNOSIS — T07XXXA Unspecified multiple injuries, initial encounter: Secondary | ICD-10-CM

## 2016-08-15 DIAGNOSIS — R404 Transient alteration of awareness: Secondary | ICD-10-CM | POA: Diagnosis not present

## 2016-08-15 DIAGNOSIS — Z79899 Other long term (current) drug therapy: Secondary | ICD-10-CM

## 2016-08-15 DIAGNOSIS — R531 Weakness: Secondary | ICD-10-CM | POA: Diagnosis not present

## 2016-08-15 DIAGNOSIS — M25561 Pain in right knee: Secondary | ICD-10-CM | POA: Diagnosis not present

## 2016-08-15 DIAGNOSIS — S37002A Unspecified injury of left kidney, initial encounter: Secondary | ICD-10-CM | POA: Insufficient documentation

## 2016-08-15 DIAGNOSIS — Y9289 Other specified places as the place of occurrence of the external cause: Secondary | ICD-10-CM | POA: Insufficient documentation

## 2016-08-15 DIAGNOSIS — J810 Acute pulmonary edema: Secondary | ICD-10-CM | POA: Diagnosis not present

## 2016-08-15 DIAGNOSIS — M25552 Pain in left hip: Secondary | ICD-10-CM | POA: Diagnosis not present

## 2016-08-15 LAB — URINALYSIS, ROUTINE W REFLEX MICROSCOPIC
Bilirubin Urine: NEGATIVE
Glucose, UA: NEGATIVE mg/dL
KETONES UR: NEGATIVE mg/dL
Leukocytes, UA: NEGATIVE
Nitrite: NEGATIVE
PROTEIN: NEGATIVE mg/dL
Specific Gravity, Urine: 1.01 (ref 1.005–1.030)
WBC UA: NONE SEEN WBC/hpf (ref 0–5)
pH: 5 (ref 5.0–8.0)

## 2016-08-15 LAB — CBC WITH DIFFERENTIAL/PLATELET
Basophils Absolute: 0 10*3/uL (ref 0.0–0.1)
Basophils Relative: 1 %
Eosinophils Absolute: 0 10*3/uL (ref 0.0–0.7)
Eosinophils Relative: 1 %
HEMATOCRIT: 44.7 % (ref 36.0–46.0)
HEMOGLOBIN: 14.2 g/dL (ref 12.0–15.0)
LYMPHS ABS: 1.8 10*3/uL (ref 0.7–4.0)
Lymphocytes Relative: 22 %
MCH: 27.3 pg (ref 26.0–34.0)
MCHC: 31.8 g/dL (ref 30.0–36.0)
MCV: 85.8 fL (ref 78.0–100.0)
MONOS PCT: 13 %
Monocytes Absolute: 1.1 10*3/uL — ABNORMAL HIGH (ref 0.1–1.0)
NEUTROS PCT: 63 %
Neutro Abs: 5.2 10*3/uL (ref 1.7–7.7)
Platelets: 380 10*3/uL (ref 150–400)
RBC: 5.21 MIL/uL — ABNORMAL HIGH (ref 3.87–5.11)
RDW: 16 % — ABNORMAL HIGH (ref 11.5–15.5)
WBC: 8.2 10*3/uL (ref 4.0–10.5)

## 2016-08-15 LAB — BASIC METABOLIC PANEL
ANION GAP: 10 (ref 5–15)
BUN: 34 mg/dL — ABNORMAL HIGH (ref 6–20)
CHLORIDE: 104 mmol/L (ref 101–111)
CO2: 24 mmol/L (ref 22–32)
CREATININE: 1.97 mg/dL — AB (ref 0.44–1.00)
Calcium: 9 mg/dL (ref 8.9–10.3)
GFR calc non Af Amer: 21 mL/min — ABNORMAL LOW (ref >60)
GFR, EST AFRICAN AMERICAN: 25 mL/min — AB (ref >60)
Glucose, Bld: 112 mg/dL — ABNORMAL HIGH (ref 65–99)
Potassium: 4.2 mmol/L (ref 3.5–5.1)
Sodium: 138 mmol/L (ref 135–145)

## 2016-08-15 MED ORDER — FENTANYL CITRATE (PF) 100 MCG/2ML IJ SOLN
50.0000 ug | Freq: Once | INTRAMUSCULAR | Status: AC
  Administered 2016-08-15: 50 ug via INTRAVENOUS
  Filled 2016-08-15: qty 2

## 2016-08-15 MED ORDER — SODIUM CHLORIDE 0.9 % IV BOLUS (SEPSIS)
500.0000 mL | Freq: Once | INTRAVENOUS | Status: AC
  Administered 2016-08-15: 500 mL via INTRAVENOUS

## 2016-08-15 MED ORDER — SODIUM CHLORIDE 0.9 % IV SOLN
INTRAVENOUS | Status: DC
  Administered 2016-08-15: 13:00:00 via INTRAVENOUS

## 2016-08-15 MED ORDER — ONDANSETRON HCL 4 MG/2ML IJ SOLN
4.0000 mg | Freq: Once | INTRAMUSCULAR | Status: AC
  Administered 2016-08-15: 4 mg via INTRAVENOUS
  Filled 2016-08-15: qty 2

## 2016-08-15 NOTE — H&P (Addendum)
History and Physical    Latoya Crane:[id number] DOB: 04/01/1924 DOA: 08/15/2016  PCP: Sunday Spillers, NP at [facility] Consultants: Luciana Axe - ophthalomlogy; [facility] cardiology Patient coming from: [facility] [facility]; NOK: daughter, Latoya Crane, [phone]  Chief Complaint: fall  HPI: Latoya Crane is a 83 y.o. female with medical history significant of afib on Coumadin; hypothyroidism; HTN; HLD; CAD s/p CABG; and pacemaker placement presenting following a fall.  [facility] called the patient's daughter this AM about 1030-11 .  She had fallen and they were transferring to [facility] by ambulance.  Daughter came in and the patient was in a lot of pain.  Usually very meek and laid back but she has been in excruciating pain.  She has chronic hip arthritis.  Also with chronic wrist swelling but that doesn't seem to bother her.  Has sundowning and crawls around at night; she also wanders the facility and can be found in other people's rooms frequently.  Last weekend, she was found lying in the floor of the dining room - they did not think she fell, just laid down in the floor.  Her left hip was bothering her severely here in the ER, "just crying with it".     ED Course:  Multiple contusions, xrays negative.  Possible AKI as cause for weakness/falls.  Review of Systems:  Unable to perform  Ambulatory Status:  Ambulates with a walker, but occasionally leaves it and can't remember that she needs it  Past Medical History:  Diagnosis Date  . Anxiety   . Atrial fibrillation (HCC)    CHADS2 score 4, on Coumadin  . Coronary artery disease    Multivessel, BMS prox LAD 06/11,subsequent CABG, LVEF 65%  . Hyperlipidemia   . Hypertension   . Hypothyroidism   . Osteoarthritis   . Tachycardia-bradycardia syndrome (HCC)    s/p PPM  . TIA (transient ischemic attack)     Past Surgical History:  Procedure Laterality Date  . BREAST BIOPSY    . CATARACT EXTRACTION W/ INTRAOCULAR LENS  IMPLANT    . CORONARY ARTERY BYPASS GRAFT  09/2009   Dr. Lorrin Jackson to LAD,SVG,to RCA  . PACEMAKER INSERTION     SJM implanted for tachy/brady syndrome  . TONSILLECTOMY    . TOTAL ABDOMINAL HYSTERECTOMY W/ BILATERAL SALPINGOOPHORECTOMY      Social History   Social History  . Marital status: Married    Spouse name: N/A  . Number of children: N/A  . Years of education: N/A   Occupational History  . Not on file.   Social History Main Topics  . Smoking status: Never Smoker  . Smokeless tobacco: Never Used  . Alcohol use No  . Drug use: Unknown  . Sexual activity: Not on file   Other Topics Concern  . Not on file   Social History Narrative   Lives near [place]    No Known Allergies  Family History  Problem Relation Age of Onset  . Coronary artery disease Neg Hx     Prior to Admission medications   Medication Sig Start Date End Date Taking? Authorizing Provider  ALPRAZolam (XANAX) 0.25 MG tablet Take 0.025 mg by mouth as needed.  03/18/14  Yes [provider]  diltiazem (CARDIZEM CD) 120 MG 24 hr capsule Take 120 mg by mouth daily.    Yes [provider]  furosemide (LASIX) 20 MG tablet Take 20 mg by mouth daily.   Yes [provider]  HYDROcodone-acetaminophen (NORCO/VICODIN) 5-325 MG tablet  Take 1 tablet by mouth every 6 (six) hours as needed for moderate pain.   Yes [provider]  isosorbide mononitrate (IMDUR) 30 MG 24 hr tablet TAKE ONE TABLET BY MOUTH DAILY (MORNING) 04/13/15  Yes [provider]  levothyroxine (SYNTHROID, LEVOTHROID) 75 MCG tablet Take 75 mcg by mouth daily before breakfast.   Yes [provider]  metoprolol tartrate (LOPRESSOR) 25 MG tablet Take 12.5 mg by mouth 2 (two) times daily.     Yes [provider]  Multiple Vitamins-Minerals (MULTIVITAMIN GUMMIES ADULT PO) Take 1 tablet by mouth 2 (two) times daily.   Yes [provider]  Multiple Vitamins-Minerals (PRESERVISION  AREDS 2 PO) Take 1 tablet by mouth 2 (two) times daily.   Yes [provider]  OLANZapine (ZYPREXA) 5 MG tablet Take 5 mg by mouth at bedtime.   Yes [provider]  pantoprazole (PROTONIX) 20 MG tablet Take 20 mg by mouth every other day. For gerd.   Yes [provider]  potassium chloride SA (K-DUR,KLOR-CON) 20 MEQ tablet Take 1 tablet by mouth Daily. 01/26/11  Yes [provider]  ranitidine (ZANTAC) 150 MG capsule Take 150 mg by mouth at bedtime as needed for heartburn.   Yes [provider]  senna-docusate (SENOKOT-S) 8.6-50 MG tablet Take 1 tablet by mouth daily.   Yes [provider]  sertraline (ZOLOFT) 25 MG tablet Take 25 mg by mouth daily.   Yes [provider]  warfarin (COUMADIN) 2 MG tablet Take 2 mg by mouth daily. Per Dr. Jeannette How office  Alternating 1/2 and whole pill each day.   Yes [provider]    Physical Exam: Vitals:   08/15/16 1500 08/15/16 1530 08/15/16 1545 08/15/16 1600  BP: (!) 146/93 136/81  (!) 139/92  Pulse: 77     Resp: 15 16 16    Temp:      SpO2: 99%        General:  Appears calm and comfortable and is NAD; she was sleeping throughout my visit other than when I palpated her left hip Eyes:  PERRL, EOMI, normal lids, iris ENT:  grossly normal hearing, lips & tongue, mmm Neck:  no LAD, masses or thyromegaly Cardiovascular:  RRR, no m/r/g. No LE edema.  Respiratory:  CTA bilaterally, no w/r/r. Normal respiratory effort. Abdomen:  soft, ntnd, NABS Skin:  no rash or induration seen on limited exam Musculoskeletal:  grossly normal tone BUE/BLE, good ROM, no bony abnormality.  Marked TTP of the left hip. Psychiatric:  Somnolent, comfortable Neurologic:  Unable to assess  Labs on Admission: I have personally reviewed following labs and imaging studies  CBC:  Recent Labs Lab 08/15/16 1231  WBC 8.2  NEUTROABS 5.2  HGB 14.2  HCT 44.7  MCV 85.8  PLT 380   Basic Metabolic  Panel:  Recent Labs Lab 08/15/16 1231  NA 138  K 4.2  CL 104  CO2 24  GLUCOSE 112*  BUN 34*  CREATININE 1.97*  CALCIUM 9.0   GFR: CrCl cannot be calculated (Unknown ideal weight.). Liver Function Tests: No results for input(s): AST, ALT, ALKPHOS, BILITOT, PROT, ALBUMIN in the last 168 hours. No results for input(s): LIPASE, AMYLASE in the last 168 hours. No results for input(s): AMMONIA in the last 168 hours. Coagulation Profile: No results for input(s): INR, PROTIME in the last 168 hours. Cardiac Enzymes: No results for input(s): CKTOTAL, CKMB, CKMBINDEX, TROPONINI in the last 168 hours. BNP (last 3 results) No results for input(s):  PROBNP in the last 8760 hours. HbA1C: No results for input(s): HGBA1C in the last 72 hours. CBG: No results for input(s): GLUCAP in the last 168 hours. Lipid Profile: No results for input(s): CHOL, HDL, LDLCALC, TRIG, CHOLHDL, LDLDIRECT in the last 72 hours. Thyroid Function Tests: No results for input(s): TSH, T4TOTAL, FREET4, T3FREE, THYROIDAB in the last 72 hours. Anemia Panel: No results for input(s): VITAMINB12, FOLATE, FERRITIN, TIBC, IRON, RETICCTPCT in the last 72 hours. Urine analysis:    Component Value Date/Time   COLORURINE YELLOW 08/15/2016 1231   APPEARANCEUR CLEAR 08/15/2016 1231   LABSPEC 1.010 08/15/2016 1231   PHURINE 5.0 08/15/2016 1231   GLUCOSEU NEGATIVE 08/15/2016 1231   HGBUR SMALL (A) 08/15/2016 1231   BILIRUBINUR NEGATIVE 08/15/2016 1231   KETONESUR NEGATIVE 08/15/2016 1231   PROTEINUR NEGATIVE 08/15/2016 1231   UROBILINOGEN 1.0 09/19/2009 1504   NITRITE NEGATIVE 08/15/2016 1231   LEUKOCYTESUR NEGATIVE 08/15/2016 1231    Creatinine Clearance: CrCl cannot be calculated (Unknown ideal weight.).  Sepsis Labs: @LABRCNTIP (procalcitonin:4,lacticidven:4) )No results found for this or any previous visit (from the past 240 hour(s)).   Radiological Exams on Admission: Dg Chest 1 View  Result Date:  08/15/2016 CLINICAL DATA:  Collapsed in hallway at nursing facility. LEFT hip pain. Fissure dementia. EXAM: CHEST 1 VIEW COMPARISON:  Chest radiograph September 30, 2015 FINDINGS: The cardiac silhouette is mildly enlarged. Status post median sternotomy for CABG. Dual lead LEFT cardiac pacemaker in situ. Mild calcific atherosclerosis of the aortic arch. Diffuse interstitial prominence without pleural effusion or focal consolidation. No pneumothorax. Osteopenia. IMPRESSION: Mild interstitial prominence, increased from prior examination can be seen with pulmonary edema, atypical infection. Mild cardiomegaly. Electronically Signed   By: Awilda Metro M.D.   On: 08/15/2016 14:08   Ct Hip Left Wo Contrast  Result Date: 08/15/2016 CLINICAL DATA:  Left hip pain secondary to a fall this morning. EXAM: CT OF THE LEFT HIP WITHOUT CONTRAST TECHNIQUE: Multidetector CT imaging of the left hip was performed according to the standard protocol. Multiplanar CT image reconstructions were also generated. COMPARISON:  Radiographs dated 08/15/2016 FINDINGS: Bones/Joint/Cartilage There is no fracture. There is joint space narrowing medially and posteriorly in the left hip. 1 cm complex subcortical cyst in the anterior superior aspect of the left acetabulum. Soft tissues Calcific tendinopathy of the hamstring tendons originating from the left ischial tuberosity. No joint effusion or appreciable soft tissue contusion. Degenerative disc disease at L5-S1, incompletely visualized. Extensive diverticulosis of the sigmoid portion of the colon. Nonspecific free fluid in the pelvis of unknown etiology. IMPRESSION: No acute abnormality of the left hip. Osteoarthritic changes of the left hip. Ascites in the pelvis of unknown etiology. Sigmoid diverticulosis. Electronically Signed   By: Francene Boyers M.D.   On: 08/15/2016 15:46   Dg Hand Complete Left  Result Date: 08/15/2016 CLINICAL DATA:  Fall with bruising to the hand EXAM: LEFT HAND -  COMPLETE 3+ VIEW COMPARISON:  None. FINDINGS: No subluxation or radiopaque foreign body. Calcification at the triangular fibrocartilage. Questionable nondisplaced fracture at the dorsal base of the middle phalanx on the lateral view, exact digit uncertain, possibly third digit. IMPRESSION: 1. Questionable nondisplaced fracture involving dorsal base of the middle phalanx on lateral view, possibly third digit. 2. Otherwise no acute osseous abnormality is seen. 3. Triangular fibrocartilage calcifications suggesting chondrocalcinosis Electronically Signed   By: Jasmine Pang M.D.   On: 08/15/2016 14:11   Dg Hand Complete Right  Result Date: 08/15/2016 CLINICAL DATA:  Collapsed pain with  bruising to both hands EXAM: RIGHT HAND - COMPLETE 3+ VIEW COMPARISON:  None. FINDINGS: No definite acute displaced fracture or malalignment. Large amount of soft tissue swelling over the dorsum of the hand. No radiopaque foreign body. Calcification at the triangular fibrocartilage. Additional soft tissue calcification adjacent to the scaphoid. IMPRESSION: 1. Large amount of dorsal soft tissue swelling. No definite acute displaced fracture is seen. 2. Calcification at the triangular fibrocartilage suggesting chondrocalcinosis. Electronically Signed   By: Jasmine PangKim  Fujinaga M.D.   On: 08/15/2016 14:09   Dg Hip Unilat With Pelvis 2-3 Views Left  Result Date: 08/15/2016 CLINICAL DATA:  Fall with left hip pain EXAM: DG HIP (WITH OR WITHOUT PELVIS) 2-3V LEFT COMPARISON:  05/11/2016 FINDINGS: No acute displaced fracture or malalignment. The SI joints are symmetric. Pubic symphysis appears intact. Small clip inferior to the right ischium. Joint space is relatively maintained. IMPRESSION: No definite acute osseous abnormality. Electronically Signed   By: Jasmine PangKim  Fujinaga M.D.   On: 08/15/2016 14:13    EKG: Independently reviewed.  Afib with rate 80; Since last tracing Atrial fibrillation has replaced sinus rhythym, QT has shortened and demand  V-pacing is present   Assessment/Plan Principal Problem:   Fall from standing Active Problems:   ATRIAL FIBRILLATION   Moderate dementia with behavioral disturbance   Kidney disease   Polypharmacy   Fall from standing -I spent approximately 60 minutes directly with this family, primarily counseling and offering options. -This was a mechanical fall in a patient with moderate to severe dementia who wanders frequently and falls often. -Fortunately, there were no apparent fractures from this fall. -However, the patient is having ongoing left hip pain that is likely associated with bruising/MSK injury. -After significant counseling about this issue as well as the others to follow, the family was offered the option of staying in observation status with the understanding that she is likely to sundown and will likely be ready for discharge tomorrow; or discharge back to the facility tonight with reassurance that her hip was not broken in the pain. -They have elected to be discharged. -I would suggest Robaxin to help with the pain  Kidney disease -BUN 34/Creatinine 1.97/GFR 21; last was in 7/11 with creatinine 1.00 -Without more recent records, it is difficult to ascertain whether this is AKI, CKD, or a combination of both.  We could attempt to obtain records from the facility and/or the physician who has been monitoring her INR until the last year - but this information is not available at this time. -Given her long-standing h/o HTN and dementia, CKD would not be unexpected. -She received a 500 cc bolus and additional IVF while in the ER. -Since the family is electing to have the patient return to the facility, BMP can be repeated in the next 1-3 days to recheck. -Would suggest holding Lasix for now, with consideration of changing it to prn.  Afib on anticoagulation -CHA2DS2-VASc score is 6, with a 9.8% stroke rate/year. -While this is a high stroke risk, she does have frequent and recurrent  falls and is at considerable risk for bleeding from these falls. -I would suggest consideration of discontinuation of AC at this time, possibly with transition to ASA monotherapy. -Obviously, this is not an appropriate inpatient decision so I counseled the family about risks/benefits and encouraged them to schedule an appointment with the facility doctor to discuss this as well as polypharmacy (see below).  Dementia with polypharmacy -The patient has advanced dementia and family reports a poor quality  of life. -She is DNR. -They seem to prefer more of a comfort care approach, if possible. -It is worth a consideration of her medications.   -I would suggest the following:  -Continue: Cardizem, Imdur, Synthroid, Lopressur, Senokot  -Discontinue Coumadin  -Change Lasix to prn - and then she may not need potassium supplementation.  -Use only 1 MVI (currently on 2 vitamins, each BID)  -Consider whether Vicodin and Xanax as truly necessary, since both are sedating medications  -Consider whether she really needs 3 psychiatric medications - Zyprexa, Zoloft, and Xanax.  Consider discontinuation of both Zoloft and Xanax.  -Consider whether she needs both Protonix and Zantac.   After discussion of all of the above issues, the family prefers for the patient to be discharged back to the facility tonight.  Thank you for the opportunity to consult on this patient.    Total length of this encounter was 105 minutes and >50% was spent in counseling and coordination of care with the family.  Jonah Blue MD [facility]  If 7PM-7AM, please contact night-coverage [email or web address] Password [email or web address]  08/15/2016, 6:30 PM

## 2016-08-15 NOTE — ED Triage Notes (Addendum)
Pt is from [facility].  EMS states staff said she was walking in the hallway and "collapsed". No injury to head. Pt c/o left hip. Right and leg pain. Bruising to both hands. HX of dementia

## 2016-08-15 NOTE — ED Notes (Signed)
ED Provider at bedside. 

## 2016-08-15 NOTE — ED Provider Notes (Signed)
[facility]-EMERG[facility]Y DEPT Provider Note   CSN: [id number] Arrival date & time: 08/15/16  1201  By signing my name below, I, [name], attest that this documentation has been prepared under the direction and in the presence of Mancel Bale, MD. Electronically Signed: [name], Scribe. 08/15/2016. 12:30 PM.  History   Chief Complaint Chief Complaint  Patient presents with  . Fall    LEVEL 5 CAVEAT D/T DEMENTIA  The history is provided by medical records and a relative. The history is limited by the condition of the patient. No language interpreter was used.    HPI Comments:  Latoya Crane is a 83 y.o. female with a PMHx of Dementia, Afib, Hypothyroidism, Osteoarthritis, HLD, TIA, Anxiety, HTN, and CAD, who presents to the Emergency Department by way of ambulance complaining of sudden onset left hip pain s/p a fall this morning at [facility]. EMS reports the pt was walking in the hallway when she "collapsed". They deny head Injury. The pt's daughter states her balance is "poor" at baseline.    Past Medical History:  Diagnosis Date  . Anxiety   . Atrial fibrillation (HCC)    CHADS2 score 4, on Coumadin  . Coronary artery disease    Multivessel, BMS prox LAD 06/11,subsequent CABG, LVEF 65%  . Hyperlipidemia   . Hypertension   . Hypothyroidism   . Osteoarthritis   . Tachycardia-bradycardia syndrome (HCC)    s/p PPM  . TIA (transient ischemic attack)     Patient Active Problem List   Diagnosis Date Noted  . Fall from standing 08/15/2016  . Kidney disease 08/15/2016  . Polypharmacy 08/15/2016  . Moderate dementia with behavioral disturbance 03/17/2016  . Pacemaker-St.Jude 02/24/2012  . DYSLIPIDEMIA 08/31/2009  . HYPERTENSION, BENIGN 08/31/2009  . CHEST PAIN 08/31/2009  . Tachycardia-bradycardia syndrome (HCC) 01/08/2009  . CAD, NATIVE VESSEL 02/18/2008  . ATRIAL FIBRILLATION 02/18/2008    Past Surgical History:  Procedure Laterality Date  . BREAST BIOPSY     . CATARACT EXTRACTION W/ INTRAOCULAR LENS IMPLANT    . CORONARY ARTERY BYPASS GRAFT  09/2009   Dr. Lorrin Jackson to LAD,SVG,to RCA  . PACEMAKER INSERTION     SJM implanted for tachy/brady syndrome  . TONSILLECTOMY    . TOTAL ABDOMINAL HYSTERECTOMY W/ BILATERAL SALPINGOOPHORECTOMY      OB History    No data available       Home Medications    Prior to Admission medications   Medication Sig Start Date End Date Taking? Authorizing Provider  ALPRAZolam (XANAX) 0.25 MG tablet Take 0.025 mg by mouth as needed.  03/18/14  Yes [provider]  diltiazem (CARDIZEM CD) 120 MG 24 hr capsule Take 120 mg by mouth daily.    Yes [provider]  HYDROcodone-acetaminophen (NORCO/VICODIN) 5-325 MG tablet Take 1 tablet by mouth every 6 (six) hours as needed for moderate pain.   Yes [provider]  isosorbide mononitrate (IMDUR) 30 MG 24 hr tablet TAKE ONE TABLET BY MOUTH DAILY (MORNING) 04/13/15  Yes [provider]  levothyroxine (SYNTHROID, LEVOTHROID) 75 MCG tablet Take 75 mcg by mouth daily before breakfast.   Yes [provider]  metoprolol tartrate (LOPRESSOR) 25 MG tablet Take 12.5 mg by mouth 2 (two) times daily.     Yes [provider]  Multiple Vitamins-Minerals (MULTIVITAMIN GUMMIES ADULT PO) Take 1 tablet by mouth 2 (two) times daily.   Yes [provider]  Multiple Vitamins-Minerals (PRESERVISION AREDS 2 PO) Take 1 tablet by mouth  2 (two) times daily.   Yes [provider]  OLANZapine (ZYPREXA) 5 MG tablet Take 5 mg by mouth at bedtime.   Yes [provider]  pantoprazole (PROTONIX) 20 MG tablet Take 20 mg by mouth every other day. For gerd.   Yes [provider]  potassium chloride SA (K-DUR,KLOR-CON) 20 MEQ tablet Take 1 tablet by mouth Daily. 01/26/11  Yes [provider]  ranitidine (ZANTAC) 150 MG capsule Take 150 mg by mouth at bedtime as needed for heartburn.   Yes [provider]  senna-docusate (SENOKOT-S) 8.6-50 MG tablet Take 1 tablet by mouth daily.   Yes [provider]  sertraline (ZOLOFT) 25 MG tablet Take 25 mg by mouth daily.   Yes [provider]  warfarin (COUMADIN) 2 MG tablet Take 2 mg by mouth daily. Per Dr. Jeannette How office  Alternating 1/2 and whole pill each day.   Yes [provider]    Family History Family History  Problem Relation Age of Onset  . Coronary artery disease Neg Hx     Social History Social History  Substance Use Topics  . Smoking status: Never Smoker  . Smokeless tobacco: Never Used  . Alcohol use No     Allergies   Patient has no known allergies.   Review of Systems Review of Systems  Unable to perform ROS: Dementia     Physical Exam Updated Vital Signs BP 128/87   Pulse 77   Temp 97.8 F (36.6 C)   Resp 14   SpO2 95%   Physical Exam  Constitutional: She is oriented to person, place, and time. She appears well-developed and well-nourished.  HENT:  Head: Normocephalic and atraumatic.  Eyes: Conjunctivae and EOM are normal. Pupils are equal, round, and reactive to light.  Neck: Normal range of motion and phonation normal. Neck supple.  Cardiovascular: Normal rate.  An irregular rhythm present.  Pulmonary/Chest: Effort normal and breath sounds normal. No respiratory distress. She exhibits no tenderness.  Abdominal: Soft. She exhibits no distension and no mass. There is no tenderness. There is no guarding.  Musculoskeletal: Normal range of motion.  Bilateral hand contusion with ecchymosis, no deformity. Left hip is tender without deformity. No other deformity or tenderness to other large joints.   Neurological: She is alert and oriented to person, place, and time. She exhibits normal muscle tone.  Skin: Skin is warm and dry.  Psychiatric: She has a normal mood and affect. Her behavior is normal. Judgment and thought content normal.  Nursing note and vitals  reviewed.    ED Treatments / Results  DIAGNOSTIC STUDIES:  Oxygen Saturation is 96% on RA, normal by my interpretation.     Labs (all labs ordered are listed, but only abnormal results are displayed) Labs Reviewed  BASIC METABOLIC PANEL - Abnormal; Notable for the following:       Result Value   Glucose, Bld 112 (*)    BUN 34 (*)    Creatinine, Ser 1.97 (*)    GFR calc non Af Amer 21 (*)    GFR calc Af Amer 25 (*)    All other components within normal limits  CBC WITH DIFFERENTIAL/PLATELET - Abnormal; Notable for the following:    RBC 5.21 (*)    RDW 16.0 (*)    Monocytes Absolute 1.1 (*)    All other components within normal limits  URINALYSIS, ROUTINE W REFLEX MICROSCOPIC - Abnormal; Notable for the following:    Hgb urine dipstick SMALL (*)  Bacteria, UA RARE (*)    Squamous Epithelial / LPF 0-5 (*)    All other components within normal limits    EKG  EKG Interpretation  Date/Time:  Monday Aug 15 2016 14:05:11 EDT Ventricular Rate:  80 PR Interval:    QRS Duration: 92 QT Interval:  404 QTC Calculation: 466 R Axis:   78 Text Interpretation:  Afib/flut and V-paced complexes No further rhythm analysis attempted due to paced rhythm Anterior infarct, old Repol abnrm suggests ischemia, lateral leads Since last tracing Atrial fibrillation has replaced sinus rhythym,  QT has shortened and demand V-pacing is present Reconfirmed by Mancel BaleWentz, Meric Joye [phone]([id number]) on 08/15/2016 2:18:14 PM       Radiology Dg Chest 1 View  Result Date: 08/15/2016 CLINICAL DATA:  Collapsed in hallway at nursing facility. LEFT hip pain. Fissure dementia. EXAM: CHEST 1 VIEW COMPARISON:  Chest radiograph September 30, 2015 FINDINGS: The cardiac silhouette is mildly enlarged. Status post median sternotomy for CABG. Dual lead LEFT cardiac pacemaker in situ. Mild calcific atherosclerosis of the aortic arch. Diffuse interstitial prominence without pleural effusion or focal consolidation. No pneumothorax.  Osteopenia. IMPRESSION: Mild interstitial prominence, increased from prior examination can be seen with pulmonary edema, atypical infection. Mild cardiomegaly. Electronically Signed   By: Awilda Metroourtnay  Bloomer M.D.   On: 08/15/2016 14:08   Ct Hip Left Wo Contrast  Result Date: 08/15/2016 CLINICAL DATA:  Left hip pain secondary to a fall this morning. EXAM: CT OF THE LEFT HIP WITHOUT CONTRAST TECHNIQUE: Multidetector CT imaging of the left hip was performed according to the standard protocol. Multiplanar CT image reconstructions were also generated. COMPARISON:  Radiographs dated 08/15/2016 FINDINGS: Bones/Joint/Cartilage There is no fracture. There is joint space narrowing medially and posteriorly in the left hip. 1 cm complex subcortical cyst in the anterior superior aspect of the left acetabulum. Soft tissues Calcific tendinopathy of the hamstring tendons originating from the left ischial tuberosity. No joint effusion or appreciable soft tissue contusion. Degenerative disc disease at L5-S1, incompletely visualized. Extensive diverticulosis of the sigmoid portion of the colon. Nonspecific free fluid in the pelvis of unknown etiology. IMPRESSION: No acute abnormality of the left hip. Osteoarthritic changes of the left hip. Ascites in the pelvis of unknown etiology. Sigmoid diverticulosis. Electronically Signed   By: Francene BoyersJames  Maxwell M.D.   On: 08/15/2016 15:46   Dg Hand Complete Left  Result Date: 08/15/2016 CLINICAL DATA:  Fall with bruising to the hand EXAM: LEFT HAND - COMPLETE 3+ VIEW COMPARISON:  None. FINDINGS: No subluxation or radiopaque foreign body. Calcification at the triangular fibrocartilage. Questionable nondisplaced fracture at the dorsal base of the middle phalanx on the lateral view, exact digit uncertain, possibly third digit. IMPRESSION: 1. Questionable nondisplaced fracture involving dorsal base of the middle phalanx on lateral view, possibly third digit. 2. Otherwise no acute osseous  abnormality is seen. 3. Triangular fibrocartilage calcifications suggesting chondrocalcinosis Electronically Signed   By: Jasmine PangKim  Fujinaga M.D.   On: 08/15/2016 14:11   Dg Hand Complete Right  Result Date: 08/15/2016 CLINICAL DATA:  Collapsed pain with bruising to both hands EXAM: RIGHT HAND - COMPLETE 3+ VIEW COMPARISON:  None. FINDINGS: No definite acute displaced fracture or malalignment. Large amount of soft tissue swelling over the dorsum of the hand. No radiopaque foreign body. Calcification at the triangular fibrocartilage. Additional soft tissue calcification adjacent to the scaphoid. IMPRESSION: 1. Large amount of dorsal soft tissue swelling. No definite acute displaced fracture is seen. 2. Calcification at the triangular fibrocartilage suggesting chondrocalcinosis.  Electronically Signed   By: Jasmine Pang M.D.   On: 08/15/2016 14:09   Dg Hip Unilat With Pelvis 2-3 Views Left  Result Date: 08/15/2016 CLINICAL DATA:  Fall with left hip pain EXAM: DG HIP (WITH OR WITHOUT PELVIS) 2-3V LEFT COMPARISON:  05/11/2016 FINDINGS: No acute displaced fracture or malalignment. The SI joints are symmetric. Pubic symphysis appears intact. Small clip inferior to the right ischium. Joint space is relatively maintained. IMPRESSION: No definite acute osseous abnormality. Electronically Signed   By: Jasmine Pang M.D.   On: 08/15/2016 14:13    Procedures Procedures (including critical care time)  Medications Ordered in ED Medications  0.9 %  sodium chloride infusion ( Intravenous Stopped 08/15/16 1423)  fentaNYL (SUBLIMAZE) injection 50 mcg (50 mcg Intravenous Given 08/15/16 1302)  ondansetron (ZOFRAN) injection 4 mg (4 mg Intravenous Given 08/15/16 1302)  sodium chloride 0.9 % bolus 500 mL (0 mLs Intravenous Stopped 08/15/16 1500)     Initial Impression / Assessment and Plan / ED Course  I have reviewed the triage vital signs and the nursing notes.  Pertinent labs & imaging results that were available  during my care of the patient were reviewed by me and considered in my medical decision making (see chart for details).  Clinical Course as of Aug 15 1857  Mon Aug 15, 2016  1631 Patient's nursing care facility is unable to accept patients with IV therapy, therefore the patient will be admitted here, to treat AKI.  [EW]    Clinical Course User Index [EW] Mancel Bale, MD     Patient Vitals for the past 24 hrs:  BP Temp Temp src Pulse Resp SpO2  08/15/16 1800 128/87 - - - 14 95 %  08/15/16 1600 (!) 139/92 - - - - -  08/15/16 1545 - - - - 16 -  08/15/16 1530 136/81 - - - 16 -  08/15/16 1500 (!) 146/93 - - 77 15 99 %  08/15/16 1330 127/81 - - 83 14 -  08/15/16 1230 (!) 116/95 - - 91 16 97 %  08/15/16 1217 - 97.8 F (36.6 C) - - - -  08/15/16 1204 (!) 152/101 - - 96 14 96 %    4:37 PM Reevaluation with update and discussion. After initial assessment and treatment, an updated evaluation reveals patient is still sleepy following treatment with fentanyl, for suspected hip fracture.  Reimaging with CT does not indicate fracture.  Findings discussed with patient's daughter and all questions answered.Mancel Bale L   The patient was seen in conjunction with hospitalist, Dr. Jonah Blue.  We each had discussions with the family, and conversed together.  The patient can be managed in her current setting, and would not meet criteria for formal admission at this time.  The family members with the patient are interested in proceeding with care at her current facility, since no aggressive interventions, are planned.  I had extensive discussions with the patient's 2 daughters regarding the best treatment plan at this time.  Patient should be more comfortable in an environment where she is familiar with.  Therefore the patient will be discharged  Final Clinical Impressions(s) / ED Diagnoses   Final diagnoses:  Fall, initial encounter  Contusion, multiple sites  AKI (acute kidney injury) (HCC)   Anticoagulated    Follow multiple contusions.  Patient has AKI which likely precipitated fall causing weakness.  Doubt fracture, sepsis, metabolic instability or impending vascular collapse.  Nursing Notes Reviewed/ Care Coordinated Applicable Imaging Reviewed Interpretation  of Laboratory Data incorporated into ED treatment  The patient appears reasonably screened and/or stabilized for discharge and I doubt any other medical condition or other [facility] requiring further screening, evaluation, or treatment in the ED at this time prior to discharge.  Plan: Home Medications-stop Lasix, now.  Consider discontinuing warfarin, Zantac, Zoloft.  Exercise caution with administering Xanax and Vicodin.; Home Treatments-try to drink 1 L of water each day, rest, cryotherapy; return here if the recommended treatment, does not improve the symptoms; Recommended follow up-PCP, as soon as possible to discuss changes in medications, and arrange for further treatment.    Plan: Admit  New Prescriptions Current Discharge Medication List      I personally performed the services described in this documentation, which was scribed in my presence. The recorded information has been reviewed and is accurate.  Mancel Bale, MD 08/15/16 1902

## 2016-08-15 NOTE — Discharge Instructions (Addendum)
Try to encourage her to drink about a liter of water each day.  Stop the Lasix, for now.  Use ice on sore spots 3 or 4 times a day, for 2 days.  For pain, use APAP, 650 mg every 4 hours.  Talk to your doctor about discontinuing warfarin, Zoloft, and Zantac.  These medications will likely cause more harm than help, at this point in her life.  Be very careful with administration of sedating medications such as Vicodin, and Xanax, which will tend to cause her more problems with ambulation, and confusion.

## 2016-08-17 DIAGNOSIS — W1839XA Other fall on same level, initial encounter: Secondary | ICD-10-CM | POA: Diagnosis not present

## 2016-08-17 DIAGNOSIS — I482 Chronic atrial fibrillation: Secondary | ICD-10-CM | POA: Diagnosis not present

## 2016-08-17 DIAGNOSIS — L0390 Cellulitis, unspecified: Secondary | ICD-10-CM | POA: Diagnosis not present

## 2016-08-17 DIAGNOSIS — G894 Chronic pain syndrome: Secondary | ICD-10-CM | POA: Diagnosis not present

## 2016-08-17 DIAGNOSIS — I10 Essential (primary) hypertension: Secondary | ICD-10-CM | POA: Diagnosis not present

## 2016-08-22 DIAGNOSIS — E039 Hypothyroidism, unspecified: Secondary | ICD-10-CM | POA: Diagnosis not present

## 2016-08-22 DIAGNOSIS — E876 Hypokalemia: Secondary | ICD-10-CM | POA: Diagnosis not present

## 2016-08-22 DIAGNOSIS — G309 Alzheimer's disease, unspecified: Secondary | ICD-10-CM | POA: Diagnosis not present

## 2016-08-31 ENCOUNTER — Emergency Department (HOSPITAL_COMMUNITY): Payer: Medicare Other

## 2016-08-31 ENCOUNTER — Encounter (HOSPITAL_COMMUNITY): Payer: Self-pay

## 2016-08-31 ENCOUNTER — Inpatient Hospital Stay (HOSPITAL_COMMUNITY)
Admission: EM | Admit: 2016-08-31 | Discharge: 2016-09-04 | DRG: 604 | Disposition: A | Discharge status: Hospice | Payer: Medicare Other | Attending: Internal Medicine | Admitting: Internal Medicine

## 2016-08-31 DIAGNOSIS — E785 Hyperlipidemia, unspecified: Secondary | ICD-10-CM | POA: Diagnosis present

## 2016-08-31 DIAGNOSIS — R748 Abnormal levels of other serum enzymes: Secondary | ICD-10-CM

## 2016-08-31 DIAGNOSIS — Z5189 Encounter for other specified aftercare: Secondary | ICD-10-CM | POA: Diagnosis not present

## 2016-08-31 DIAGNOSIS — Z515 Encounter for palliative care: Secondary | ICD-10-CM

## 2016-08-31 DIAGNOSIS — Z7982 Long term (current) use of aspirin: Secondary | ICD-10-CM

## 2016-08-31 DIAGNOSIS — T402X5A Adverse effect of other opioids, initial encounter: Secondary | ICD-10-CM | POA: Diagnosis present

## 2016-08-31 DIAGNOSIS — N179 Acute kidney failure, unspecified: Secondary | ICD-10-CM | POA: Diagnosis present

## 2016-08-31 DIAGNOSIS — I481 Persistent atrial fibrillation: Secondary | ICD-10-CM | POA: Diagnosis not present

## 2016-08-31 DIAGNOSIS — Z79891 Long term (current) use of opiate analgesic: Secondary | ICD-10-CM | POA: Diagnosis not present

## 2016-08-31 DIAGNOSIS — I10 Essential (primary) hypertension: Secondary | ICD-10-CM | POA: Diagnosis not present

## 2016-08-31 DIAGNOSIS — Z90710 Acquired absence of both cervix and uterus: Secondary | ICD-10-CM

## 2016-08-31 DIAGNOSIS — S3991XA Unspecified injury of abdomen, initial encounter: Secondary | ICD-10-CM | POA: Diagnosis not present

## 2016-08-31 DIAGNOSIS — F419 Anxiety disorder, unspecified: Secondary | ICD-10-CM | POA: Diagnosis present

## 2016-08-31 DIAGNOSIS — Z8673 Personal history of transient ischemic attack (TIA), and cerebral infarction without residual deficits: Secondary | ICD-10-CM | POA: Diagnosis not present

## 2016-08-31 DIAGNOSIS — S3013XA Contusion of flank (latus) region, initial encounter: Secondary | ICD-10-CM | POA: Diagnosis present

## 2016-08-31 DIAGNOSIS — Z951 Presence of aortocoronary bypass graft: Secondary | ICD-10-CM

## 2016-08-31 DIAGNOSIS — Z79899 Other long term (current) drug therapy: Secondary | ICD-10-CM

## 2016-08-31 DIAGNOSIS — I4891 Unspecified atrial fibrillation: Secondary | ICD-10-CM | POA: Diagnosis present

## 2016-08-31 DIAGNOSIS — Z66 Do not resuscitate: Secondary | ICD-10-CM | POA: Diagnosis present

## 2016-08-31 DIAGNOSIS — F0390 Unspecified dementia without behavioral disturbance: Secondary | ICD-10-CM | POA: Diagnosis not present

## 2016-08-31 DIAGNOSIS — S299XXA Unspecified injury of thorax, initial encounter: Secondary | ICD-10-CM | POA: Diagnosis not present

## 2016-08-31 DIAGNOSIS — Z7189 Other specified counseling: Secondary | ICD-10-CM | POA: Diagnosis not present

## 2016-08-31 DIAGNOSIS — F0391 Unspecified dementia with behavioral disturbance: Secondary | ICD-10-CM | POA: Diagnosis present

## 2016-08-31 DIAGNOSIS — K7200 Acute and subacute hepatic failure without coma: Secondary | ICD-10-CM

## 2016-08-31 DIAGNOSIS — Y92129 Unspecified place in nursing home as the place of occurrence of the external cause: Secondary | ICD-10-CM

## 2016-08-31 DIAGNOSIS — K7682 Hepatic encephalopathy: Secondary | ICD-10-CM

## 2016-08-31 DIAGNOSIS — S301XXA Contusion of abdominal wall, initial encounter: Principal | ICD-10-CM | POA: Diagnosis present

## 2016-08-31 DIAGNOSIS — R17 Unspecified jaundice: Secondary | ICD-10-CM

## 2016-08-31 DIAGNOSIS — R296 Repeated falls: Secondary | ICD-10-CM | POA: Diagnosis not present

## 2016-08-31 DIAGNOSIS — S301XXD Contusion of abdominal wall, subsequent encounter: Secondary | ICD-10-CM | POA: Diagnosis not present

## 2016-08-31 DIAGNOSIS — W19XXXA Unspecified fall, initial encounter: Secondary | ICD-10-CM | POA: Diagnosis present

## 2016-08-31 DIAGNOSIS — R222 Localized swelling, mass and lump, trunk: Secondary | ICD-10-CM | POA: Diagnosis not present

## 2016-08-31 DIAGNOSIS — G92 Toxic encephalopathy: Secondary | ICD-10-CM | POA: Diagnosis not present

## 2016-08-31 DIAGNOSIS — I480 Paroxysmal atrial fibrillation: Secondary | ICD-10-CM | POA: Diagnosis not present

## 2016-08-31 DIAGNOSIS — N289 Disorder of kidney and ureter, unspecified: Secondary | ICD-10-CM | POA: Diagnosis not present

## 2016-08-31 DIAGNOSIS — S199XXA Unspecified injury of neck, initial encounter: Secondary | ICD-10-CM | POA: Diagnosis not present

## 2016-08-31 DIAGNOSIS — D62 Acute posthemorrhagic anemia: Secondary | ICD-10-CM | POA: Diagnosis not present

## 2016-08-31 DIAGNOSIS — W1839XA Other fall on same level, initial encounter: Secondary | ICD-10-CM | POA: Diagnosis present

## 2016-08-31 DIAGNOSIS — L0390 Cellulitis, unspecified: Secondary | ICD-10-CM | POA: Diagnosis not present

## 2016-08-31 DIAGNOSIS — I495 Sick sinus syndrome: Secondary | ICD-10-CM | POA: Diagnosis present

## 2016-08-31 DIAGNOSIS — R51 Headache: Secondary | ICD-10-CM | POA: Diagnosis not present

## 2016-08-31 DIAGNOSIS — M1990 Unspecified osteoarthritis, unspecified site: Secondary | ICD-10-CM | POA: Diagnosis not present

## 2016-08-31 DIAGNOSIS — R1900 Intra-abdominal and pelvic swelling, mass and lump, unspecified site: Secondary | ICD-10-CM | POA: Diagnosis not present

## 2016-08-31 DIAGNOSIS — Z950 Presence of cardiac pacemaker: Secondary | ICD-10-CM | POA: Diagnosis present

## 2016-08-31 DIAGNOSIS — T148XXA Other injury of unspecified body region, initial encounter: Secondary | ICD-10-CM | POA: Diagnosis not present

## 2016-08-31 DIAGNOSIS — E860 Dehydration: Secondary | ICD-10-CM | POA: Diagnosis not present

## 2016-08-31 DIAGNOSIS — R109 Unspecified abdominal pain: Secondary | ICD-10-CM | POA: Diagnosis not present

## 2016-08-31 DIAGNOSIS — E039 Hypothyroidism, unspecified: Secondary | ICD-10-CM | POA: Diagnosis not present

## 2016-08-31 DIAGNOSIS — M546 Pain in thoracic spine: Secondary | ICD-10-CM | POA: Diagnosis not present

## 2016-08-31 DIAGNOSIS — I2510 Atherosclerotic heart disease of native coronary artery without angina pectoris: Secondary | ICD-10-CM | POA: Diagnosis present

## 2016-08-31 LAB — CBC WITH DIFFERENTIAL/PLATELET
BASOS PCT: 0 %
Basophils Absolute: 0 10*3/uL (ref 0.0–0.1)
EOS ABS: 0.1 10*3/uL (ref 0.0–0.7)
EOS PCT: 1 %
HCT: 45.3 % (ref 36.0–46.0)
Hemoglobin: 13.9 g/dL (ref 12.0–15.0)
LYMPHS ABS: 2.6 10*3/uL (ref 0.7–4.0)
Lymphocytes Relative: 23 %
MCH: 26.3 pg (ref 26.0–34.0)
MCHC: 30.7 g/dL (ref 30.0–36.0)
MCV: 85.8 fL (ref 78.0–100.0)
MONO ABS: 1.1 10*3/uL — AB (ref 0.1–1.0)
MONOS PCT: 9 %
Neutro Abs: 7.6 10*3/uL (ref 1.7–7.7)
Neutrophils Relative %: 67 %
Platelets: 403 10*3/uL — ABNORMAL HIGH (ref 150–400)
RBC: 5.28 MIL/uL — ABNORMAL HIGH (ref 3.87–5.11)
RDW: 17.4 % — AB (ref 11.5–15.5)
WBC: 11.5 10*3/uL — ABNORMAL HIGH (ref 4.0–10.5)

## 2016-08-31 LAB — COMPREHENSIVE METABOLIC PANEL
ALBUMIN: 4.2 g/dL (ref 3.5–5.0)
ALK PHOS: 136 U/L — AB (ref 38–126)
ALT: 36 U/L (ref 14–54)
AST: 43 U/L — AB (ref 15–41)
Anion gap: 12 (ref 5–15)
BUN: 30 mg/dL — AB (ref 6–20)
CHLORIDE: 102 mmol/L (ref 101–111)
CO2: 25 mmol/L (ref 22–32)
CREATININE: 1.5 mg/dL — AB (ref 0.44–1.00)
Calcium: 9.3 mg/dL (ref 8.9–10.3)
GFR calc non Af Amer: 29 mL/min — ABNORMAL LOW (ref >60)
GFR, EST AFRICAN AMERICAN: 34 mL/min — AB (ref >60)
GLUCOSE: 102 mg/dL — AB (ref 65–99)
Potassium: 4.1 mmol/L (ref 3.5–5.1)
SODIUM: 139 mmol/L (ref 135–145)
Total Bilirubin: 1.6 mg/dL — ABNORMAL HIGH (ref 0.3–1.2)
Total Protein: 7.7 g/dL (ref 6.5–8.1)

## 2016-08-31 LAB — PROTIME-INR
INR: 1.36
Prothrombin Time: 16.9 seconds — ABNORMAL HIGH (ref 11.4–15.2)

## 2016-08-31 LAB — TYPE AND SCREEN
ABO/RH(D): O POS
ANTIBODY SCREEN: NEGATIVE

## 2016-08-31 MED ORDER — MORPHINE SULFATE (PF) 4 MG/ML IV SOLN
4.0000 mg | INTRAVENOUS | Status: AC | PRN
  Administered 2016-08-31 (×2): 4 mg via INTRAVENOUS
  Filled 2016-08-31: qty 1

## 2016-08-31 MED ORDER — MORPHINE SULFATE (PF) 4 MG/ML IV SOLN
INTRAVENOUS | Status: AC
  Administered 2016-08-31: 4 mg via INTRAVENOUS
  Filled 2016-08-31: qty 1

## 2016-08-31 MED ORDER — ONDANSETRON HCL 4 MG/2ML IJ SOLN
INTRAMUSCULAR | Status: AC
  Administered 2016-08-31: 4 mg
  Filled 2016-08-31: qty 2

## 2016-08-31 NOTE — ED Triage Notes (Signed)
Pt is resident of [facility] where she reportedly fell backward against a computer desk and landed against her left lower back and left posterior ribs.  Pt has a large swelling to the left flank.

## 2016-08-31 NOTE — ED Provider Notes (Signed)
[facility] DEPT Provider Note   CSN: [id number] Arrival date & time: 08/31/16  2043     History   Chief Complaint Chief Complaint  Patient presents with  . Fall    Possible rib fractures    HPI Latoya Crane is a 83 y.o. female.  The history is provided by the nursing home. The history is limited by the condition of the patient (Dementia).  She is reported to have fallen at the nursing facility where she resides. She apparently struck her left side on a desk. She is complaining of severe pain. She is not able to give any additional history.  Past Medical History:  Diagnosis Date  . Anxiety   . Atrial fibrillation (HCC)    CHADS2 score 4, on Coumadin  . Coronary artery disease    Multivessel, BMS prox LAD 06/11,subsequent CABG, LVEF 65%  . Hyperlipidemia   . Hypertension   . Hypothyroidism   . Osteoarthritis   . Tachycardia-bradycardia syndrome (HCC)    s/p PPM  . TIA (transient ischemic attack)     Patient Active Problem List   Diagnosis Date Noted  . Fall from standing 08/15/2016  . Kidney disease 08/15/2016  . Polypharmacy 08/15/2016  . Moderate dementia with behavioral disturbance 03/17/2016  . Pacemaker-St.Jude 02/24/2012  . DYSLIPIDEMIA 08/31/2009  . HYPERTENSION, BENIGN 08/31/2009  . CHEST PAIN 08/31/2009  . Tachycardia-bradycardia syndrome (HCC) 01/08/2009  . CAD, NATIVE VESSEL 02/18/2008  . ATRIAL FIBRILLATION 02/18/2008    Past Surgical History:  Procedure Laterality Date  . BREAST BIOPSY    . CATARACT EXTRACTION W/ INTRAOCULAR LENS IMPLANT    . CORONARY ARTERY BYPASS GRAFT  09/2009   Dr. Lorrin Jackson to LAD,SVG,to RCA  . PACEMAKER INSERTION     SJM implanted for tachy/brady syndrome  . TONSILLECTOMY    . TOTAL ABDOMINAL HYSTERECTOMY W/ BILATERAL SALPINGOOPHORECTOMY      OB History    No data available       Home Medications    Prior to Admission medications   Medication Sig Start Date End Date Taking? Authorizing Provider    ALPRAZolam (XANAX) 0.25 MG tablet Take 0.025 mg by mouth as needed.  03/18/14   [provider]  diltiazem (CARDIZEM CD) 120 MG 24 hr capsule Take 120 mg by mouth daily.     [provider]  HYDROcodone-acetaminophen (NORCO/VICODIN) 5-325 MG tablet Take 1 tablet by mouth every 6 (six) hours as needed for moderate pain.    [provider]  isosorbide mononitrate (IMDUR) 30 MG 24 hr tablet TAKE ONE TABLET BY MOUTH DAILY (MORNING) 04/13/15   [provider]  levothyroxine (SYNTHROID, LEVOTHROID) 75 MCG tablet Take 75 mcg by mouth daily before breakfast.    [provider]  metoprolol tartrate (LOPRESSOR) 25 MG tablet Take 12.5 mg by mouth 2 (two) times daily.      [provider]  Multiple Vitamins-Minerals (MULTIVITAMIN GUMMIES ADULT PO) Take 1 tablet by mouth 2 (two) times daily.    [provider]  Multiple Vitamins-Minerals (PRESERVISION AREDS 2 PO) Take 1 tablet by mouth 2 (two) times daily.    [provider]  OLANZapine (ZYPREXA) 5 MG tablet Take 5 mg by mouth at bedtime.    [provider]  pantoprazole (PROTONIX) 20 MG tablet Take 20 mg by mouth every other day. For gerd.    [provider]  potassium chloride SA (K-DUR,KLOR-CON) 20 MEQ tablet Take 1 tablet by mouth Daily. 01/26/11   [provider]  ranitidine (ZANTAC) 150 MG capsule Take 150 mg by mouth at bedtime as needed for heartburn.    [provider]  senna-docusate (SENOKOT-S) 8.6-50 MG tablet Take 1 tablet by mouth daily.    [provider]  sertraline (ZOLOFT) 25 MG tablet Take 25 mg by mouth daily.    [provider]  warfarin (COUMADIN) 2 MG tablet Take 2 mg by mouth daily. Per Dr. Jeannette How office  Alternating 1/2 and whole pill each day.    [provider]    Family History Family History  Problem Relation Age of Onset  . Coronary artery disease Neg Hx     Social History Social History   Substance Use Topics  . Smoking status: Never Smoker  . Smokeless tobacco: Never Used  . Alcohol use No     Allergies   Patient has no known allergies.   Review of Systems Review of Systems  Unable to perform ROS: Dementia     Physical Exam Updated Vital Signs BP (!) 179/113 (BP Location: Right Arm)   Pulse 86   Temp 98.1 F (36.7 C) (Oral)   Resp (!) 22   SpO2 100%   Physical Exam  Nursing note and vitals reviewed.  83 year old female, in obvious pain, but in no respiratory distress. Vital signs are significant for hypertension and tachypnea. Oxygen saturation is 100%, which is normal. Head is normocephalic and atraumatic. PERRLA, EOMI. Oropharynx is clear. Neck is nontender without adenopathy or JVD. Back is nontender in the midline. There is a large hematoma palpable in the left flank which is tender to palpation. Lungs are clear without rales, wheezes, or rhonchi. Chest is nontender. Heart has regular rate and rhythm without murmur. Abdomen is soft, flat, with vague tenderness diffusely. There are no masses or hepatosplenomegaly and peristalsis is normoactive. Extremities have no cyanosis or edema, full range of motion is present. Skin is warm and dry without rash. Neurologic: She is awake and crying but is not able to hold a conversation, cranial nerves are intact, there are no motor or sensory deficits.  ED Treatments / Results  Labs (all labs ordered are listed, but only abnormal results are displayed) Labs Reviewed  COMPREHENSIVE METABOLIC PANEL - Abnormal; Notable for the following:       Result Value   Glucose, Bld 102 (*)    BUN 30 (*)    Creatinine, Ser 1.50 (*)    AST 43 (*)    Alkaline Phosphatase 136 (*)    Total Bilirubin 1.6 (*)    GFR calc non Af Amer 29 (*)    GFR calc Af Amer 34 (*)    All other components within normal limits  CBC WITH DIFFERENTIAL/PLATELET - Abnormal; Notable for the following:    WBC 11.5 (*)    RBC 5.28 (*)    RDW  17.4 (*)    Platelets 403 (*)    Monocytes Absolute 1.1 (*)    All other components within normal limits  PROTIME-INR - Abnormal; Notable for the following:    Prothrombin Time 16.9 (*)    All other components within normal limits  URINALYSIS, ROUTINE W REFLEX MICROSCOPIC  TYPE AND SCREEN    Radiology Ct Abdomen Pelvis Wo Contrast  Result Date: 08/31/2016 CLINICAL DATA:  Fall with large swelling to the left flank EXAM: CT CHEST, ABDOMEN AND PELVIS WITHOUT CONTRAST TECHNIQUE: Multidetector CT imaging of the chest, abdomen and pelvis was performed following the standard protocol without IV contrast. COMPARISON:  Radiograph 08/15/2016, CT 08/15/2016 FINDINGS: CT CHEST FINDINGS Cardiovascular: Limited evaluation without the presence of intravenous contrast. Moderate atherosclerotic calcifications of the aorta. Post CABG changes. Coronary artery calcifications. Mild cardiomegaly. Partially visualized intracardiac pacing leads. No large pericardial effusion. Mediastinum/Nodes: Midline trachea. No thyroid mass. No gross mediastinal hematoma. No significantly enlarged mediastinal lymph nodes. Limited assessment for hilar nodes. Esophagus within normal limits Lungs/Pleura: No pneumothorax. Mild apical scarring. No focal consolidation. Trace right pleural effusion. Musculoskeletal: No acute displaced rib fracture. Mild superior endplate compression fracture of T11, uncertain acuity. CT ABDOMEN PELVIS FINDINGS Hepatobiliary: No focal hepatic abnormality. High density sludge or multiple small stones in the gallbladder. No biliary dilatation Pancreas: Unremarkable. No pancreatic ductal dilatation or surrounding inflammatory changes. Spleen: Granuloma.  Otherwise negative. Adrenals/Urinary Tract: Adrenal glands are within normal limits. No hydronephrosis. The bladder is unremarkable. Stomach/Bowel: The stomach is nonenlarged. No dilated small bowel. No colon wall thickening. Sigmoid diverticular disease without acute  inflammation. Vascular/Lymphatic: Aortic atherosclerosis. No enlarged abdominal or pelvic lymph nodes. Reproductive: Status post hysterectomy. No adnexal masses. Other: No free air or free fluid. Musculoskeletal: Lumbar spine demonstrates trace retrolisthesis of L5 on S1. Grade 1 anterolisthesis of L3 on L4 and L4 on L5. No acute osseous abnormality. Large hyperdense mass in the left flank area, this measures 10.1 cm AP x 5.6 cm transverse 13 cm craniocaudad. Hematocrit level is present. Edema surrounding the hyperdense mass. This would be consistent with a soft tissue hematoma. IMPRESSION: 1. Large 13 x 10.1 x 5.6 cm hyperdense mass within the left flank soft tissues extending from the level of the inferior ribs to the left iliac crest ; this would be consistent with a large soft tissue hematoma. No underlying left rib fracture. 2. Negative for acute thoracic injury allowing for absence of contrast. 3. No definite CT evidence for acute solid organ injury allowing for absence of contrast, no free air or free fluid 4. Age indeterminate superior endplate compression deformity of T11. 5. Hyperdense sludge or gallstones in the gallbladder Electronically Signed   By: Jasmine Pang M.D.   On: 08/31/2016 22:43   Ct Head Wo Contrast  Result Date: 08/31/2016 CLINICAL DATA:  Patient fell backward against the computer chest. Pain. EXAM: CT HEAD WITHOUT CONTRAST CT CERVICAL SPINE WITHOUT CONTRAST TECHNIQUE: Multidetector CT imaging of the head and cervical spine was performed following the standard protocol without intravenous contrast. Multiplanar CT image reconstructions of the cervical spine were also generated. COMPARISON:  07/26/2016 FINDINGS: CT HEAD FINDINGS Brain: Chronic stable superficial and moderate central atrophy with moderate degree of small vessel ischemia. Small bilateral basal ganglial lacunar infarcts. No acute intracranial hemorrhage, midline shift or edema. No extra-axial fluid collections. No  intra-axial mass lesions. No acute large vascular territory infarct. The fourth ventricle and basal cisterns are midline without effacement. Vascular: No hyperdense vessels. Atherosclerosis of the carotid siphons and right vertebral artery. Skull: No acute skull fracture nor primary osseous lesions. Sinuses/Orbits: Minimal mucosal thickening of the anterior ethmoid sinus. No acute sinusitis. Clear mastoids. Bilateral lens replacements. Intact orbits and globes. Other: Suggests a mild periorbital soft tissue swelling on the right. CT CERVICAL SPINE FINDINGS Alignment: Chronic stable appearing anterolisthesis of C3 on C4 by 2 mm, C4 on C5 by 4 mm and 2 mm of C5 on C6. Bilateral facet arthropathy without jumped appearing facets. Skull base and vertebrae: The craniocervical relationship is maintained. No skullbase fracture. No cervical vertebral body fracture bone destruction. Soft tissues and spinal canal: No prevertebral fluid or  swelling. No visible canal hematoma. Disc levels: Chronic stable degenerative disc disease C4-5, C5-6 and C6-7 with hypertrophic facet arthropathy as before. Upper chest: Apical scarring is noted of the lung apices. Extracranial carotid arteriosclerosis. No evidence of thyroid mass. Other: None IMPRESSION: 1. Cerebral atrophy with chronic small vessel ischemic disease of white matter. No acute intracranial abnormality. 2. Similar appearance of the cervical spine with multilevel degenerative anterolisthesis of C4 through C7. No fracture or bone destruction. Electronically Signed   By: Tollie Ethavid  Kwon M.D.   On: 08/31/2016 22:34   Ct Chest Wo Contrast  Result Date: 08/31/2016 CLINICAL DATA:  Fall with large swelling to the left flank EXAM: CT CHEST, ABDOMEN AND PELVIS WITHOUT CONTRAST TECHNIQUE: Multidetector CT imaging of the chest, abdomen and pelvis was performed following the standard protocol without IV contrast. COMPARISON:  Radiograph 08/15/2016, CT 08/15/2016 FINDINGS: CT CHEST FINDINGS  Cardiovascular: Limited evaluation without the presence of intravenous contrast. Moderate atherosclerotic calcifications of the aorta. Post CABG changes. Coronary artery calcifications. Mild cardiomegaly. Partially visualized intracardiac pacing leads. No large pericardial effusion. Mediastinum/Nodes: Midline trachea. No thyroid mass. No gross mediastinal hematoma. No significantly enlarged mediastinal lymph nodes. Limited assessment for hilar nodes. Esophagus within normal limits Lungs/Pleura: No pneumothorax. Mild apical scarring. No focal consolidation. Trace right pleural effusion. Musculoskeletal: No acute displaced rib fracture. Mild superior endplate compression fracture of T11, uncertain acuity. CT ABDOMEN PELVIS FINDINGS Hepatobiliary: No focal hepatic abnormality. High density sludge or multiple small stones in the gallbladder. No biliary dilatation Pancreas: Unremarkable. No pancreatic ductal dilatation or surrounding inflammatory changes. Spleen: Granuloma.  Otherwise negative. Adrenals/Urinary Tract: Adrenal glands are within normal limits. No hydronephrosis. The bladder is unremarkable. Stomach/Bowel: The stomach is nonenlarged. No dilated small bowel. No colon wall thickening. Sigmoid diverticular disease without acute inflammation. Vascular/Lymphatic: Aortic atherosclerosis. No enlarged abdominal or pelvic lymph nodes. Reproductive: Status post hysterectomy. No adnexal masses. Other: No free air or free fluid. Musculoskeletal: Lumbar spine demonstrates trace retrolisthesis of L5 on S1. Grade 1 anterolisthesis of L3 on L4 and L4 on L5. No acute osseous abnormality. Large hyperdense mass in the left flank area, this measures 10.1 cm AP x 5.6 cm transverse 13 cm craniocaudad. Hematocrit level is present. Edema surrounding the hyperdense mass. This would be consistent with a soft tissue hematoma. IMPRESSION: 1. Large 13 x 10.1 x 5.6 cm hyperdense mass within the left flank soft tissues extending from the  level of the inferior ribs to the left iliac crest ; this would be consistent with a large soft tissue hematoma. No underlying left rib fracture. 2. Negative for acute thoracic injury allowing for absence of contrast. 3. No definite CT evidence for acute solid organ injury allowing for absence of contrast, no free air or free fluid 4. Age indeterminate superior endplate compression deformity of T11. 5. Hyperdense sludge or gallstones in the gallbladder Electronically Signed   By: Jasmine PangKim  Fujinaga M.D.   On: 08/31/2016 22:43   Ct Cervical Spine Wo Contrast  Result Date: 08/31/2016 CLINICAL DATA:  Patient fell backward against the computer chest. Pain. EXAM: CT HEAD WITHOUT CONTRAST CT CERVICAL SPINE WITHOUT CONTRAST TECHNIQUE: Multidetector CT imaging of the head and cervical spine was performed following the standard protocol without intravenous contrast. Multiplanar CT image reconstructions of the cervical spine were also generated. COMPARISON:  07/26/2016 FINDINGS: CT HEAD FINDINGS Brain: Chronic stable superficial and moderate central atrophy with moderate degree of small vessel ischemia. Small bilateral basal ganglial lacunar infarcts. No acute intracranial hemorrhage, midline shift  or edema. No extra-axial fluid collections. No intra-axial mass lesions. No acute large vascular territory infarct. The fourth ventricle and basal cisterns are midline without effacement. Vascular: No hyperdense vessels. Atherosclerosis of the carotid siphons and right vertebral artery. Skull: No acute skull fracture nor primary osseous lesions. Sinuses/Orbits: Minimal mucosal thickening of the anterior ethmoid sinus. No acute sinusitis. Clear mastoids. Bilateral lens replacements. Intact orbits and globes. Other: Suggests a mild periorbital soft tissue swelling on the right. CT CERVICAL SPINE FINDINGS Alignment: Chronic stable appearing anterolisthesis of C3 on C4 by 2 mm, C4 on C5 by 4 mm and 2 mm of C5 on C6. Bilateral facet  arthropathy without jumped appearing facets. Skull base and vertebrae: The craniocervical relationship is maintained. No skullbase fracture. No cervical vertebral body fracture bone destruction. Soft tissues and spinal canal: No prevertebral fluid or swelling. No visible canal hematoma. Disc levels: Chronic stable degenerative disc disease C4-5, C5-6 and C6-7 with hypertrophic facet arthropathy as before. Upper chest: Apical scarring is noted of the lung apices. Extracranial carotid arteriosclerosis. No evidence of thyroid mass. Other: None IMPRESSION: 1. Cerebral atrophy with chronic small vessel ischemic disease of white matter. No acute intracranial abnormality. 2. Similar appearance of the cervical spine with multilevel degenerative anterolisthesis of C4 through C7. No fracture or bone destruction. Electronically Signed   By: Tollie Eth M.D.   On: 08/31/2016 22:34    Procedures Procedures (including critical care time)  Medications Ordered in ED Medications  morphine 4 MG/ML injection 4 mg (4 mg Intravenous Given 08/31/16 2236)  ondansetron (ZOFRAN) 4 MG/2ML injection (4 mg  Given 08/31/16 2114)     Initial Impression / Assessment and Plan / ED Course  I have reviewed the triage vital signs and the nursing notes.  Pertinent labs & imaging results that were available during my care of the patient were reviewed by me and considered in my medical decision making (see chart for details).  Fall with left flank injury and hematoma. Old records are reviewed, and she is reported to have been on warfarin, but I am unable to confirm that. She does have other ED visits for falls. ECG from recent ED visit does show atrial fibrillation. She will be sent for CT to evaluate possible intracranial injury and possible internal injury. INR will be checked to see if she needs to have warfarin reversal.  INR has come back normal. She has had good pain relief with morphine, but oxygen saturation has dropped to  89-90%. Oxygen saturation has increased to 98-100% with nasal oxygen. CT scans show large flank hematoma but no other acute injury. Family has arrived and confirmed DO NOT RESUSCITATE status but stated that they would like blood transfusion if necessary. I feel that she needs to be admitted for serial hemoglobins, titration of pain medication, and supplemental oxygen as needed. Hemoglobin is actually normal now, but will likely come down as she elevates. Mild renal insufficiency is present which is asked improved over baseline. Case is discussed with Dr. Onalee Hua of [facility] who agrees to admit the patient.  Final Clinical Impressions(s) / ED Diagnoses   Final diagnoses:  Fall at nursing home, initial encounter  Hematoma of left flank, initial encounter  Renal insufficiency  Serum total bilirubin elevated  Elevated alkaline phosphatase level    New Prescriptions New Prescriptions   No medications on file     Dione Booze, MD 09/01/16 0003

## 2016-09-01 DIAGNOSIS — S301XXA Contusion of abdominal wall, initial encounter: Principal | ICD-10-CM

## 2016-09-01 DIAGNOSIS — W19XXXA Unspecified fall, initial encounter: Secondary | ICD-10-CM | POA: Diagnosis not present

## 2016-09-01 DIAGNOSIS — N289 Disorder of kidney and ureter, unspecified: Secondary | ICD-10-CM

## 2016-09-01 DIAGNOSIS — Z950 Presence of cardiac pacemaker: Secondary | ICD-10-CM

## 2016-09-01 DIAGNOSIS — S301XXD Contusion of abdominal wall, subsequent encounter: Secondary | ICD-10-CM

## 2016-09-01 DIAGNOSIS — D62 Acute posthemorrhagic anemia: Secondary | ICD-10-CM

## 2016-09-01 DIAGNOSIS — F0390 Unspecified dementia without behavioral disturbance: Secondary | ICD-10-CM

## 2016-09-01 DIAGNOSIS — K7200 Acute and subacute hepatic failure without coma: Secondary | ICD-10-CM

## 2016-09-01 DIAGNOSIS — F0391 Unspecified dementia with behavioral disturbance: Secondary | ICD-10-CM

## 2016-09-01 DIAGNOSIS — K7682 Hepatic encephalopathy: Secondary | ICD-10-CM

## 2016-09-01 DIAGNOSIS — I481 Persistent atrial fibrillation: Secondary | ICD-10-CM

## 2016-09-01 DIAGNOSIS — T148XXA Other injury of unspecified body region, initial encounter: Secondary | ICD-10-CM

## 2016-09-01 DIAGNOSIS — I495 Sick sinus syndrome: Secondary | ICD-10-CM

## 2016-09-01 DIAGNOSIS — I480 Paroxysmal atrial fibrillation: Secondary | ICD-10-CM | POA: Diagnosis not present

## 2016-09-01 LAB — URINALYSIS, ROUTINE W REFLEX MICROSCOPIC
BILIRUBIN URINE: NEGATIVE
GLUCOSE, UA: NEGATIVE mg/dL
HGB URINE DIPSTICK: NEGATIVE
Ketones, ur: NEGATIVE mg/dL
LEUKOCYTES UA: NEGATIVE
NITRITE: NEGATIVE
Protein, ur: 100 mg/dL — AB
SPECIFIC GRAVITY, URINE: 1.02 (ref 1.005–1.030)
Squamous Epithelial / LPF: NONE SEEN
pH: 5 (ref 5.0–8.0)

## 2016-09-01 LAB — CBC
HEMATOCRIT: 33.8 % — AB (ref 36.0–46.0)
HEMOGLOBIN: 10.3 g/dL — AB (ref 12.0–15.0)
MCH: 26.3 pg (ref 26.0–34.0)
MCHC: 30.5 g/dL (ref 30.0–36.0)
MCV: 86.2 fL (ref 78.0–100.0)
Platelets: 308 10*3/uL (ref 150–400)
RBC: 3.92 MIL/uL (ref 3.87–5.11)
RDW: 17.2 % — ABNORMAL HIGH (ref 11.5–15.5)
WBC: 9.3 10*3/uL (ref 4.0–10.5)

## 2016-09-01 LAB — MRSA PCR SCREENING: MRSA by PCR: NEGATIVE

## 2016-09-01 MED ORDER — ISOSORBIDE MONONITRATE 20 MG PO TABS: 10.0000 mg | ORAL_TABLET | Freq: Every day | ORAL | Status: DC

## 2016-09-01 MED ORDER — LEVOTHYROXINE SODIUM 100 MCG IV SOLR
37.5000 ug | Freq: Every day | INTRAVENOUS | Status: DC
  Administered 2016-09-01 – 2016-09-02 (×2): 37.5 ug via INTRAVENOUS
  Filled 2016-09-01 (×6): qty 5

## 2016-09-01 MED ORDER — MORPHINE SULFATE (PF) 2 MG/ML IV SOLN
1.0000 mg | INTRAVENOUS | Status: DC | PRN
  Administered 2016-09-01 – 2016-09-02 (×2): 1 mg via INTRAVENOUS
  Filled 2016-09-01 (×2): qty 1

## 2016-09-01 MED ORDER — ONDANSETRON HCL 4 MG PO TABS: 4.0000 mg | ORAL_TABLET | Freq: Four times a day (QID) | ORAL | Status: DC | PRN

## 2016-09-01 MED ORDER — POTASSIUM CHLORIDE IN NACL 20-0.9 MEQ/L-% IV SOLN
INTRAVENOUS | Status: DC
  Administered 2016-09-01: 18:00:00 via INTRAVENOUS
  Administered 2016-09-02: 150 mL via INTRAVENOUS

## 2016-09-01 MED ORDER — SENNOSIDES-DOCUSATE SODIUM 8.6-50 MG PO TABS: 1.0000 | ORAL_TABLET | Freq: Every day | ORAL | Status: DC

## 2016-09-01 MED ORDER — METOPROLOL TARTRATE 25 MG PO TABS
12.5000 mg | ORAL_TABLET | Freq: Two times a day (BID) | ORAL | Status: DC
  Administered 2016-09-01: 12.5 mg via ORAL
  Filled 2016-09-01: qty 1

## 2016-09-01 MED ORDER — OLANZAPINE 5 MG PO TABS
2.5000 mg | ORAL_TABLET | Freq: Every day | ORAL | Status: DC
  Administered 2016-09-01: 2.5 mg via ORAL
  Filled 2016-09-01: qty 1

## 2016-09-01 MED ORDER — HYDROCODONE-ACETAMINOPHEN 5-325 MG PO TABS
1.0000 | ORAL_TABLET | ORAL | Status: DC | PRN
Start: 2016-09-01 — End: 2016-09-01
  Administered 2016-09-01: 2 via ORAL
  Filled 2016-09-01: qty 2

## 2016-09-01 MED ORDER — METOPROLOL TARTRATE 5 MG/5ML IV SOLN: 2.5000 mg | Freq: Four times a day (QID) | INTRAVENOUS | Status: DC | PRN

## 2016-09-01 MED ORDER — DILTIAZEM HCL ER COATED BEADS 120 MG PO CP24: 120.0000 mg | ORAL_CAPSULE | Freq: Every day | ORAL | Status: DC

## 2016-09-01 MED ORDER — ALPRAZOLAM 0.25 MG PO TABS: 0.0250 mg | ORAL_TABLET | Freq: Two times a day (BID) | ORAL | Status: DC | PRN

## 2016-09-01 MED ORDER — LEVOTHYROXINE SODIUM 75 MCG PO TABS: 75.0000 ug | ORAL_TABLET | Freq: Every day | ORAL | Status: DC

## 2016-09-01 MED ORDER — SERTRALINE HCL 50 MG PO TABS: 25.0000 mg | ORAL_TABLET | Freq: Every day | ORAL | Status: DC

## 2016-09-01 MED ORDER — SODIUM CHLORIDE 0.9% FLUSH
3.0000 mL | Freq: Two times a day (BID) | INTRAVENOUS | Status: DC
  Administered 2016-09-01 – 2016-09-03 (×5): 3 mL via INTRAVENOUS

## 2016-09-01 MED ORDER — SODIUM CHLORIDE 0.9% FLUSH: 3.0000 mL | INTRAVENOUS | Status: DC | PRN

## 2016-09-01 MED ORDER — SODIUM CHLORIDE 0.9 % IV SOLN: 250.0000 mL | INTRAVENOUS | Status: DC | PRN

## 2016-09-01 MED ORDER — FUROSEMIDE 20 MG PO TABS: 20.0000 mg | ORAL_TABLET | Freq: Every day | ORAL | Status: DC

## 2016-09-01 MED ORDER — POTASSIUM CHLORIDE 2 MEQ/ML IV SOLN: INTRAVENOUS | Status: DC

## 2016-09-01 MED ORDER — ONDANSETRON HCL 4 MG/2ML IJ SOLN: 4.0000 mg | Freq: Four times a day (QID) | INTRAMUSCULAR | Status: DC | PRN

## 2016-09-01 NOTE — Progress Notes (Signed)
Patient still has not voided.  Bladder scan continues to show 200 cc of urine in bladder.  MD notified text page.  Patient also more alert at this time.  Speaks but keeps eyes closed.

## 2016-09-01 NOTE — H&P (Signed)
History and Physical    Latoya Crane:[id number] DOB: February 18, 1925 DOA: 08/31/2016  PCP: Sunday Spillers, NP  Patient coming from: SNF  Chief Complaint:   Larey Seat and has enlarging mass to left flank  HPI: Latoya Crane is a 83 y.o. female with medical history significant of dementia, comes in after falling backwards hitting her left side on a desk at her SNF.  Pt was sent to ED for enlarging mass to left area.  Pt is demented and cannot provide history.  Family has left for the night.  Pt has been found to have a large hematoma to left flank area from below ribs to iliac crest.  She is referred for admission to monitor her hgb level overnight.    Review of Systems: unobtainalbe  Past Medical History:  Diagnosis Date  . Anxiety   . Atrial fibrillation (HCC)    CHADS2 score 4, on Coumadin  . Coronary artery disease    Multivessel, BMS prox LAD 06/11,subsequent CABG, LVEF 65%  . Hyperlipidemia   . Hypertension   . Hypothyroidism   . Osteoarthritis   . Tachycardia-bradycardia syndrome (HCC)    s/p PPM  . TIA (transient ischemic attack)     Past Surgical History:  Procedure Laterality Date  . BREAST BIOPSY    . CATARACT EXTRACTION W/ INTRAOCULAR LENS IMPLANT    . CORONARY ARTERY BYPASS GRAFT  09/2009   Dr. Lorrin Jackson to LAD,SVG,to RCA  . PACEMAKER INSERTION     SJM implanted for tachy/brady syndrome  . TONSILLECTOMY    . TOTAL ABDOMINAL HYSTERECTOMY W/ BILATERAL SALPINGOOPHORECTOMY       reports that she has never smoked. She has never used smokeless tobacco. She reports that she does not drink alcohol. Her drug history is not on file.  No Known Allergies  Family History  Problem Relation Age of Onset  . Coronary artery disease Neg Hx     Prior to Admission medications   Medication Sig Start Date End Date Taking? Authorizing Provider  ALPRAZolam (XANAX) 0.25 MG tablet Take 0.025 mg by mouth 2 (two) times daily as needed for anxiety.  03/18/14  Yes  [provider]  aspirin EC 81 MG tablet Take 81 mg by mouth daily.   Yes [provider]  diltiazem (CARDIZEM CD) 120 MG 24 hr capsule Take 120 mg by mouth daily.    Yes [provider]  furosemide (LASIX) 40 MG tablet Take 20 mg by mouth daily.   Yes [provider]  HYDROcodone-acetaminophen (NORCO/VICODIN) 5-325 MG tablet Take 0.5 tablets by mouth 3 (three) times daily.    Yes [provider]  isosorbide mononitrate (ISMO,MONOKET) 10 MG tablet Take 10 mg by mouth daily.   Yes [provider]  levothyroxine (SYNTHROID, LEVOTHROID) 75 MCG tablet Take 75 mcg by mouth daily before breakfast.   Yes [provider]  metoprolol tartrate (LOPRESSOR) 25 MG tablet Take 12.5 mg by mouth 2 (two) times daily.     Yes [provider]  Multiple Vitamins-Minerals (PRESERVISION AREDS 2 PO) Take 1 tablet by mouth 2 (two) times daily.   Yes [provider]  OLANZapine (ZYPREXA) 2.5 MG tablet Take 2.5 mg by mouth at bedtime.    Yes [provider]  potassium chloride SA (K-DUR,KLOR-CON) 20 MEQ tablet Take 1 tablet by mouth Daily. 01/26/11  Yes [provider]  ranitidine (ZANTAC) 150 MG capsule Take 150 mg by mouth at bedtime.    Yes [provider]  senna-docusate (SENOKOT-S) 8.6-50 MG tablet Take 1 tablet by mouth daily.   Yes [provider]  sertraline (ZOLOFT) 25 MG tablet Take 25 mg by mouth daily.   Yes [provider]    Physical Exam: Vitals:   08/31/16 2245 08/31/16 2246 08/31/16 2330 09/01/16 0000  BP: (!) 158/98 (!) 158/98 110/66 100/74  Pulse: 65 83 76 73  Resp:  20 11 12   Temp:      TempSrc:      SpO2: (!) 89% 98% 94% 95%      Constitutional: NAD, calm, comfortable Vitals:   08/31/16 2245 08/31/16 2246 08/31/16 2330 09/01/16 0000  BP: (!) 158/98 (!) 158/98 110/66 100/74  Pulse: 65 83 76 73  Resp:  20 11 12   Temp:      TempSrc:      SpO2: (!) 89% 98% 94% 95%     Eyes: PERRL, lids and conjunctivae normal ENMT: Mucous membranes are moist. Posterior pharynx clear of any exudate or lesions.Normal dentition.  Neck: normal, supple, no masses, no thyromegaly Respiratory: clear to auscultation bilaterally, no wheezing, no crackles. Normal respiratory effort. No accessory muscle use.  Cardiovascular: Regular rate and rhythm, no murmurs / rubs / gallops. No extremity edema. 2+ pedal pulses. No carotid bruits.  Abdomen: left tenderness, no masses palpated. No hepatosplenomegaly. Bowel sounds positive.  Musculoskeletal: no clubbing / cyanosis. No joint deformity upper and lower extremities. Good ROM, no contractures. Normal muscle tone.  Large hematoma to left side from rib cage down to hip, very painful Skin: no rashes, lesions, ulcers. No induration Neurologic: CN 2-12 grossly intact. Sensation intact, DTR normal. Strength 5/5 in all 4.  Psychiatric: demented  Labs on Admission: I have personally reviewed following labs and imaging studies  CBC:  Recent Labs Lab 08/31/16 2112  WBC 11.5*  NEUTROABS 7.6  HGB 13.9  HCT 45.3  MCV 85.8  PLT 403*   Basic Metabolic Panel:  Recent Labs Lab 08/31/16 2112  NA 139  K 4.1  CL 102  CO2 25  GLUCOSE 102*  BUN 30*  CREATININE 1.50*  CALCIUM 9.3   GFR: CrCl cannot be calculated (Unknown ideal weight.). Liver Function Tests:  Recent Labs Lab 08/31/16 2112  AST 43*  ALT 36  ALKPHOS 136*  BILITOT 1.6*  PROT 7.7  ALBUMIN 4.2   Coagulation Profile:  Recent Labs Lab 08/31/16 2112  INR 1.36   Urine analysis:    Component Value Date/Time   COLORURINE YELLOW 08/31/2016 2246   APPEARANCEUR CLEAR 08/31/2016 2246   LABSPEC 1.020 08/31/2016 2246   PHURINE 5.0 08/31/2016 2246   GLUCOSEU NEGATIVE 08/31/2016 2246   HGBUR NEGATIVE 08/31/2016 2246   BILIRUBINUR NEGATIVE 08/31/2016 2246   KETONESUR NEGATIVE 08/31/2016 2246   PROTEINUR 100 (A) 08/31/2016 2246   UROBILINOGEN 1.0 09/19/2009 1504    NITRITE NEGATIVE 08/31/2016 2246   LEUKOCYTESUR NEGATIVE 08/31/2016 2246   Radiological Exams on Admission: Ct Abdomen Pelvis Wo Contrast  Result Date: 08/31/2016 CLINICAL DATA:  Fall with large swelling to the left flank EXAM: CT CHEST, ABDOMEN AND PELVIS WITHOUT CONTRAST TECHNIQUE: Multidetector CT imaging of the chest, abdomen and pelvis was performed following the standard protocol without IV contrast. COMPARISON:  Radiograph 08/15/2016, CT 08/15/2016 FINDINGS: CT CHEST FINDINGS Cardiovascular: Limited evaluation without the presence of intravenous contrast. Moderate atherosclerotic calcifications of the aorta. Post CABG changes. Coronary artery calcifications. Mild cardiomegaly. Partially visualized intracardiac pacing leads. No large pericardial effusion. Mediastinum/Nodes: Midline trachea. No thyroid mass. No  gross mediastinal hematoma. No significantly enlarged mediastinal lymph nodes. Limited assessment for hilar nodes. Esophagus within normal limits Lungs/Pleura: No pneumothorax. Mild apical scarring. No focal consolidation. Trace right pleural effusion. Musculoskeletal: No acute displaced rib fracture. Mild superior endplate compression fracture of T11, uncertain acuity. CT ABDOMEN PELVIS FINDINGS Hepatobiliary: No focal hepatic abnormality. High density sludge or multiple small stones in the gallbladder. No biliary dilatation Pancreas: Unremarkable. No pancreatic ductal dilatation or surrounding inflammatory changes. Spleen: Granuloma.  Otherwise negative. Adrenals/Urinary Tract: Adrenal glands are within normal limits. No hydronephrosis. The bladder is unremarkable. Stomach/Bowel: The stomach is nonenlarged. No dilated small bowel. No colon wall thickening. Sigmoid diverticular disease without acute inflammation. Vascular/Lymphatic: Aortic atherosclerosis. No enlarged abdominal or pelvic lymph nodes. Reproductive: Status post hysterectomy. No adnexal masses. Other: No free air or free fluid.  Musculoskeletal: Lumbar spine demonstrates trace retrolisthesis of L5 on S1. Grade 1 anterolisthesis of L3 on L4 and L4 on L5. No acute osseous abnormality. Large hyperdense mass in the left flank area, this measures 10.1 cm AP x 5.6 cm transverse 13 cm craniocaudad. Hematocrit level is present. Edema surrounding the hyperdense mass. This would be consistent with a soft tissue hematoma. IMPRESSION: 1. Large 13 x 10.1 x 5.6 cm hyperdense mass within the left flank soft tissues extending from the level of the inferior ribs to the left iliac crest ; this would be consistent with a large soft tissue hematoma. No underlying left rib fracture. 2. Negative for acute thoracic injury allowing for absence of contrast. 3. No definite CT evidence for acute solid organ injury allowing for absence of contrast, no free air or free fluid 4. Age indeterminate superior endplate compression deformity of T11. 5. Hyperdense sludge or gallstones in the gallbladder Electronically Signed   By: Jasmine PangKim  Fujinaga M.D.   On: 08/31/2016 22:43   Ct Head Wo Contrast  Result Date: 08/31/2016 CLINICAL DATA:  Patient fell backward against the computer chest. Pain. EXAM: CT HEAD WITHOUT CONTRAST CT CERVICAL SPINE WITHOUT CONTRAST TECHNIQUE: Multidetector CT imaging of the head and cervical spine was performed following the standard protocol without intravenous contrast. Multiplanar CT image reconstructions of the cervical spine were also generated. COMPARISON:  07/26/2016 FINDINGS: CT HEAD FINDINGS Brain: Chronic stable superficial and moderate central atrophy with moderate degree of small vessel ischemia. Small bilateral basal ganglial lacunar infarcts. No acute intracranial hemorrhage, midline shift or edema. No extra-axial fluid collections. No intra-axial mass lesions. No acute large vascular territory infarct. The fourth ventricle and basal cisterns are midline without effacement. Vascular: No hyperdense vessels. Atherosclerosis of the carotid  siphons and right vertebral artery. Skull: No acute skull fracture nor primary osseous lesions. Sinuses/Orbits: Minimal mucosal thickening of the anterior ethmoid sinus. No acute sinusitis. Clear mastoids. Bilateral lens replacements. Intact orbits and globes. Other: Suggests a mild periorbital soft tissue swelling on the right. CT CERVICAL SPINE FINDINGS Alignment: Chronic stable appearing anterolisthesis of C3 on C4 by 2 mm, C4 on C5 by 4 mm and 2 mm of C5 on C6. Bilateral facet arthropathy without jumped appearing facets. Skull base and vertebrae: The craniocervical relationship is maintained. No skullbase fracture. No cervical vertebral body fracture bone destruction. Soft tissues and spinal canal: No prevertebral fluid or swelling. No visible canal hematoma. Disc levels: Chronic stable degenerative disc disease C4-5, C5-6 and C6-7 with hypertrophic facet arthropathy as before. Upper chest: Apical scarring is noted of the lung apices. Extracranial carotid arteriosclerosis. No evidence of thyroid mass. Other: None IMPRESSION: 1. Cerebral atrophy with  chronic small vessel ischemic disease of white matter. No acute intracranial abnormality. 2. Similar appearance of the cervical spine with multilevel degenerative anterolisthesis of C4 through C7. No fracture or bone destruction. Electronically Signed   By: Tollie Eth M.D.   On: 08/31/2016 22:34   Ct Chest Wo Contrast  Result Date: 08/31/2016 CLINICAL DATA:  Fall with large swelling to the left flank EXAM: CT CHEST, ABDOMEN AND PELVIS WITHOUT CONTRAST TECHNIQUE: Multidetector CT imaging of the chest, abdomen and pelvis was performed following the standard protocol without IV contrast. COMPARISON:  Radiograph 08/15/2016, CT 08/15/2016 FINDINGS: CT CHEST FINDINGS Cardiovascular: Limited evaluation without the presence of intravenous contrast. Moderate atherosclerotic calcifications of the aorta. Post CABG changes. Coronary artery calcifications. Mild cardiomegaly.  Partially visualized intracardiac pacing leads. No large pericardial effusion. Mediastinum/Nodes: Midline trachea. No thyroid mass. No gross mediastinal hematoma. No significantly enlarged mediastinal lymph nodes. Limited assessment for hilar nodes. Esophagus within normal limits Lungs/Pleura: No pneumothorax. Mild apical scarring. No focal consolidation. Trace right pleural effusion. Musculoskeletal: No acute displaced rib fracture. Mild superior endplate compression fracture of T11, uncertain acuity. CT ABDOMEN PELVIS FINDINGS Hepatobiliary: No focal hepatic abnormality. High density sludge or multiple small stones in the gallbladder. No biliary dilatation Pancreas: Unremarkable. No pancreatic ductal dilatation or surrounding inflammatory changes. Spleen: Granuloma.  Otherwise negative. Adrenals/Urinary Tract: Adrenal glands are within normal limits. No hydronephrosis. The bladder is unremarkable. Stomach/Bowel: The stomach is nonenlarged. No dilated small bowel. No colon wall thickening. Sigmoid diverticular disease without acute inflammation. Vascular/Lymphatic: Aortic atherosclerosis. No enlarged abdominal or pelvic lymph nodes. Reproductive: Status post hysterectomy. No adnexal masses. Other: No free air or free fluid. Musculoskeletal: Lumbar spine demonstrates trace retrolisthesis of L5 on S1. Grade 1 anterolisthesis of L3 on L4 and L4 on L5. No acute osseous abnormality. Large hyperdense mass in the left flank area, this measures 10.1 cm AP x 5.6 cm transverse 13 cm craniocaudad. Hematocrit level is present. Edema surrounding the hyperdense mass. This would be consistent with a soft tissue hematoma. IMPRESSION: 1. Large 13 x 10.1 x 5.6 cm hyperdense mass within the left flank soft tissues extending from the level of the inferior ribs to the left iliac crest ; this would be consistent with a large soft tissue hematoma. No underlying left rib fracture. 2. Negative for acute thoracic injury allowing for absence  of contrast. 3. No definite CT evidence for acute solid organ injury allowing for absence of contrast, no free air or free fluid 4. Age indeterminate superior endplate compression deformity of T11. 5. Hyperdense sludge or gallstones in the gallbladder Electronically Signed   By: Jasmine Pang M.D.   On: 08/31/2016 22:43   Ct Cervical Spine Wo Contrast  Result Date: 08/31/2016 CLINICAL DATA:  Patient fell backward against the computer chest. Pain. EXAM: CT HEAD WITHOUT CONTRAST CT CERVICAL SPINE WITHOUT CONTRAST TECHNIQUE: Multidetector CT imaging of the head and cervical spine was performed following the standard protocol without intravenous contrast. Multiplanar CT image reconstructions of the cervical spine were also generated. COMPARISON:  07/26/2016 FINDINGS: CT HEAD FINDINGS Brain: Chronic stable superficial and moderate central atrophy with moderate degree of small vessel ischemia. Small bilateral basal ganglial lacunar infarcts. No acute intracranial hemorrhage, midline shift or edema. No extra-axial fluid collections. No intra-axial mass lesions. No acute large vascular territory infarct. The fourth ventricle and basal cisterns are midline without effacement. Vascular: No hyperdense vessels. Atherosclerosis of the carotid siphons and right vertebral artery. Skull: No acute skull fracture nor primary osseous  lesions. Sinuses/Orbits: Minimal mucosal thickening of the anterior ethmoid sinus. No acute sinusitis. Clear mastoids. Bilateral lens replacements. Intact orbits and globes. Other: Suggests a mild periorbital soft tissue swelling on the right. CT CERVICAL SPINE FINDINGS Alignment: Chronic stable appearing anterolisthesis of C3 on C4 by 2 mm, C4 on C5 by 4 mm and 2 mm of C5 on C6. Bilateral facet arthropathy without jumped appearing facets. Skull base and vertebrae: The craniocervical relationship is maintained. No skullbase fracture. No cervical vertebral body fracture bone destruction. Soft tissues  and spinal canal: No prevertebral fluid or swelling. No visible canal hematoma. Disc levels: Chronic stable degenerative disc disease C4-5, C5-6 and C6-7 with hypertrophic facet arthropathy as before. Upper chest: Apical scarring is noted of the lung apices. Extracranial carotid arteriosclerosis. No evidence of thyroid mass. Other: None IMPRESSION: 1. Cerebral atrophy with chronic small vessel ischemic disease of white matter. No acute intracranial abnormality. 2. Similar appearance of the cervical spine with multilevel degenerative anterolisthesis of C4 through C7. No fracture or bone destruction. Electronically Signed   By: Tollie Eth M.D.   On: 08/31/2016 22:34    Assessment/Plan 83 yo female h/o dementia with mechanical fall tonight with no LOC but with developing large hematoma to left flank region Principal Problem:   Hematoma- large.  Painful.  Will monitor overnight and watch hgb level.  Main issue with pt will likely be pain control with her dementia.  Will increase her norco orally and provide morphine iv prn.  Ct shows no internal injury but hematoma is impressive.  Only on aspirin which will be held.  Active Problems:   ATRIAL FIBRILLATION- holding aspirin.  Cont bblocker and ccb   Pacemaker-[employer]- noted   Fall from standing- noted   Dementia- noted  pt is DNR   DVT prophylaxis: scds  Code Status:  DNR Family Communication: none  Disposition Plan:   Per day team Consults called:  none Admission status:   observation   Ileene Allie A MD [facility]  If 7PM-7AM, please contact night-coverage [email or web address] Password [facility]  09/01/2016, 12:23 AM

## 2016-09-01 NOTE — Progress Notes (Signed)
Patient has not voided since admission.  External female urinary system in place.  Bladder scan reveals approx 200 cc of urine.  MD notified via text page.

## 2016-09-01 NOTE — Progress Notes (Addendum)
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 The patient has had increasing leg frequent falls in the past 2-3 months. The patient has had ED visits resulted from complications of falls on 05/11/2016, 07/26/2016, and 08/15/2016. The patient was taken off of warfarin after her last ED visit on 08/15/2016. She was started on aspirin. According to the patient's daughter, the patient has had increasing instability with her gait and lower extremity weakness resulting in more difficulty with ambulation in the past. 4 days. In addition, the patient has had poor oral intake. There is no reports of vomiting, diarrhea, chest pain, respiratory distress. Initial evaluation in the emergency department including CT of the cervical spine, chest, and abdomen did not reveal any traumatic injuries. CT of the abdomen showed a  large left flank hematoma measuring 10.1 x 5.6 x 13 cm.  Assessment/Plan: Acute blood loss anemia -Secondary to large left flank hematoma -Hemoglobin 13.9>>>10.3 -Check coags -Trend hemoglobin -Remains hemodynamically stable  Acute toxic encephalopathy -The patient is very somnolent on examination 09/01/2016 -Likely secondary to a combination of opioids in the setting of renal dysfunction--patient was given 2 x morphine 4mg  and hydrocodone -d/c IV opioids, hydrocodone -tramadol for severe pain for now--re-evaluate when pt is more alert -UA neg for pyuria -CT brain--neg for acute findings -Discontinue alprazolam -Switched to IV medications until the patient is more alert and able to take po safely  Large left flank  hematoma -Secondary to mechanical fall -Holding ASA -CT cervical spine negative for fracture or dislocation  Dementia with behavioral disturbance -Continue Zoloft, Zyprexa when patient able to take po safely  Persistent atrial fibrillation -Rate controlled -Hold diltiazem secondary to self blood pressure -Convert Lopressor to IV -CHADS-VASc = 4--poor candidate for [facility] due to frequent falls with bleeding complications  Coronary artery disease -Continue Imdur -holding ASA due to ABLA  Tachybradycardia syndrome -Status post permanent pacemaker -follow Dr. Hillis RangeJames Allred  Renal failure of unknown chronicity -monitor BMP       Disposition Plan:   SNF vs [facility] in 1-2 days  Family Communication:   Sister updated at bedside 6/7--Total time spent 35 minutes.  Greater than 50% spent face to face counseling and coordinating care.  0945 to 1020   Consultants:  none  Code Status:  DNR  DVT Prophylaxis:  SCDs   Procedures: As Listed in Progress Note Above  Antibiotics: None    Subjective: The patient is somnolent. She is unable to provide review of systems. No reports of her spider distress, rectal pain, vomiting, diarrhea.  Objective: Vitals:   09/01/16 0000 09/01/16 0030 09/01/16 0110 09/01/16 0535  BP: 100/74 100/70 113/66 108/79  Pulse: 73 72 81 74  Resp: 12 14 18 18   Temp:   97.6 F (36.4 C) 97.5 F (36.4 C)  TempSrc:   Axillary Axillary  SpO2: 95% 100% 100% 100%  Weight:   49.9 kg (110 lb 0.2 oz)    No intake or output data in the 24 hours ending 09/01/16 1002 Weight change:  Exam:   General:  Pt is  Somnolent, not in acute distress.  arousable to protopathic stimuli but falls back asleep when not stimulated  HEENT: No icterus, No thrush, No neck mass, [place]/AT  Cardiovascular: IRRR, S1/S2, no rubs, no gallops  Respiratory: Poor inspiratory effort but clear to auscultation. No wheezing  Abdomen: Soft/+BS, non tender, non distended, no  guarding  Extremities: No edema, No lymphangitis, No petechiae, No rashes, no synovitis   Data Reviewed: I have personally reviewed following labs and imaging studies Basic Metabolic Panel:  Recent Labs Lab 08/31/16 2112  NA 139  K 4.1  CL 102  CO2 25  GLUCOSE 102*  BUN 30*  CREATININE 1.50*  CALCIUM 9.3   Liver Function Tests:  Recent Labs Lab 08/31/16 2112  AST 43*  ALT 36  ALKPHOS 136*  BILITOT 1.6*  PROT 7.7  ALBUMIN 4.2   No results for input(s): LIPASE, AMYLASE in the last 168 hours. No results for input(s): AMMONIA in the last 168 hours. Coagulation Profile:  Recent Labs Lab 08/31/16 2112  INR 1.36   CBC:  Recent Labs Lab 08/31/16 2112 09/01/16 0626  WBC 11.5* 9.3  NEUTROABS 7.6  --   HGB 13.9 10.3*  HCT 45.3 33.8*  MCV 85.8 86.2  PLT 403* 308   Cardiac Enzymes: No results for input(s): CKTOTAL, CKMB, CKMBINDEX, TROPONINI in the last 168 hours. BNP: Invalid input(s): POCBNP CBG: No results for input(s): GLUCAP in the last 168 hours. HbA1C: No results for input(s): HGBA1C in the last 72 hours. Urine analysis:    Component Value Date/Time   COLORURINE YELLOW 08/31/2016 2246   APPEARANCEUR CLEAR 08/31/2016 2246   LABSPEC 1.020 08/31/2016 2246   PHURINE 5.0 08/31/2016 2246   GLUCOSEU NEGATIVE 08/31/2016 2246   HGBUR NEGATIVE 08/31/2016 2246   BILIRUBINUR NEGATIVE 08/31/2016 2246   KETONESUR NEGATIVE 08/31/2016 2246   PROTEINUR 100 (A) 08/31/2016 2246   UROBILINOGEN 1.0 09/19/2009 1504   NITRITE NEGATIVE 08/31/2016 2246   LEUKOCYTESUR NEGATIVE 08/31/2016 2246   Sepsis Labs: @LABRCNTIP (procalcitonin:4,lacticidven:4) ) Recent Results (from the past 240 hour(s))  MRSA PCR Screening     Status: None   Collection Time: 09/01/16  1:55 AM  Result Value Ref Range Status   MRSA by PCR NEGATIVE NEGATIVE Final    Comment:        The GeneXpert MRSA Assay (FDA approved for NASAL specimens only), is one component of a comprehensive MRSA  colonization surveillance program. It is not intended to diagnose MRSA infection nor to guide or monitor treatment for MRSA infections.      Scheduled Meds: . diltiazem  120 mg Oral Daily  . furosemide  20 mg Oral Daily  . isosorbide mononitrate  10 mg Oral Daily  . levothyroxine  75 mcg Oral QAC breakfast  . metoprolol tartrate  12.5 mg Oral BID  . OLANZapine  2.5 mg Oral QHS  . senna-docusate  1 tablet Oral Daily  . sertraline  25 mg Oral Daily  . sodium chloride flush  3 mL Intravenous Q12H   Continuous Infusions: . sodium chloride      Procedures/Studies: Ct Abdomen Pelvis Wo Contrast  Result Date: 08/31/2016 CLINICAL DATA:  Fall with large swelling to the left flank EXAM: CT CHEST, ABDOMEN AND PELVIS WITHOUT CONTRAST TECHNIQUE: Multidetector CT imaging of the chest, abdomen and pelvis was performed following the standard protocol without IV contrast. COMPARISON:  Radiograph 08/15/2016, CT 08/15/2016 FINDINGS: CT CHEST FINDINGS Cardiovascular: Limited evaluation without the presence of intravenous contrast. Moderate atherosclerotic calcifications of the aorta. Post CABG changes. Coronary artery calcifications. Mild cardiomegaly. Partially visualized intracardiac pacing leads. No large pericardial effusion. Mediastinum/Nodes:  Midline trachea. No thyroid mass. No gross mediastinal hematoma. No significantly enlarged mediastinal lymph nodes. Limited assessment for hilar nodes. Esophagus within normal limits Lungs/Pleura: No pneumothorax. Mild apical scarring. No focal consolidation. Trace right pleural effusion. Musculoskeletal: No acute displaced rib fracture. Mild superior endplate compression fracture of T11, uncertain acuity. CT ABDOMEN PELVIS FINDINGS Hepatobiliary: No focal hepatic abnormality. High density sludge or multiple small stones in the gallbladder. No biliary dilatation Pancreas: Unremarkable. No pancreatic ductal dilatation or surrounding inflammatory changes. Spleen:  Granuloma.  Otherwise negative. Adrenals/Urinary Tract: Adrenal glands are within normal limits. No hydronephrosis. The bladder is unremarkable. Stomach/Bowel: The stomach is nonenlarged. No dilated small bowel. No colon wall thickening. Sigmoid diverticular disease without acute inflammation. Vascular/Lymphatic: Aortic atherosclerosis. No enlarged abdominal or pelvic lymph nodes. Reproductive: Status post hysterectomy. No adnexal masses. Other: No free air or free fluid. Musculoskeletal: Lumbar spine demonstrates trace retrolisthesis of L5 on S1. Grade 1 anterolisthesis of L3 on L4 and L4 on L5. No acute osseous abnormality. Large hyperdense mass in the left flank area, this measures 10.1 cm AP x 5.6 cm transverse 13 cm craniocaudad. Hematocrit level is present. Edema surrounding the hyperdense mass. This would be consistent with a soft tissue hematoma. IMPRESSION: 1. Large 13 x 10.1 x 5.6 cm hyperdense mass within the left flank soft tissues extending from the level of the inferior ribs to the left iliac crest ; this would be consistent with a large soft tissue hematoma. No underlying left rib fracture. 2. Negative for acute thoracic injury allowing for absence of contrast. 3. No definite CT evidence for acute solid organ injury allowing for absence of contrast, no free air or free fluid 4. Age indeterminate superior endplate compression deformity of T11. 5. Hyperdense sludge or gallstones in the gallbladder Electronically Signed   By: Jasmine Pang M.D.   On: 08/31/2016 22:43   Dg Chest 1 View  Result Date: 08/15/2016 CLINICAL DATA:  Collapsed in hallway at nursing facility. LEFT hip pain. Fissure dementia. EXAM: CHEST 1 VIEW COMPARISON:  Chest radiograph September 30, 2015 FINDINGS: The cardiac silhouette is mildly enlarged. Status post median sternotomy for CABG. Dual lead LEFT cardiac pacemaker in situ. Mild calcific atherosclerosis of the aortic arch. Diffuse interstitial prominence without pleural effusion or  focal consolidation. No pneumothorax. Osteopenia. IMPRESSION: Mild interstitial prominence, increased from prior examination can be seen with pulmonary edema, atypical infection. Mild cardiomegaly. Electronically Signed   By: Awilda Metro M.D.   On: 08/15/2016 14:08   Ct Head Wo Contrast  Result Date: 08/31/2016 CLINICAL DATA:  Patient fell backward against the computer chest. Pain. EXAM: CT HEAD WITHOUT CONTRAST CT CERVICAL SPINE WITHOUT CONTRAST TECHNIQUE: Multidetector CT imaging of the head and cervical spine was performed following the standard protocol without intravenous contrast. Multiplanar CT image reconstructions of the cervical spine were also generated. COMPARISON:  07/26/2016 FINDINGS: CT HEAD FINDINGS Brain: Chronic stable superficial and moderate central atrophy with moderate degree of small vessel ischemia. Small bilateral basal ganglial lacunar infarcts. No acute intracranial hemorrhage, midline shift or edema. No extra-axial fluid collections. No intra-axial mass lesions. No acute large vascular territory infarct. The fourth ventricle and basal cisterns are midline without effacement. Vascular: No hyperdense vessels. Atherosclerosis of the carotid siphons and right vertebral artery. Skull: No acute skull fracture nor primary osseous lesions. Sinuses/Orbits: Minimal mucosal thickening of the anterior ethmoid sinus. No acute sinusitis. Clear mastoids. Bilateral lens replacements. Intact orbits and globes. Other: Suggests a mild periorbital soft tissue swelling on the  right. CT CERVICAL SPINE FINDINGS Alignment: Chronic stable appearing anterolisthesis of C3 on C4 by 2 mm, C4 on C5 by 4 mm and 2 mm of C5 on C6. Bilateral facet arthropathy without jumped appearing facets. Skull base and vertebrae: The craniocervical relationship is maintained. No skullbase fracture. No cervical vertebral body fracture bone destruction. Soft tissues and spinal canal: No prevertebral fluid or swelling. No  visible canal hematoma. Disc levels: Chronic stable degenerative disc disease C4-5, C5-6 and C6-7 with hypertrophic facet arthropathy as before. Upper chest: Apical scarring is noted of the lung apices. Extracranial carotid arteriosclerosis. No evidence of thyroid mass. Other: None IMPRESSION: 1. Cerebral atrophy with chronic small vessel ischemic disease of white matter. No acute intracranial abnormality. 2. Similar appearance of the cervical spine with multilevel degenerative anterolisthesis of C4 through C7. No fracture or bone destruction. Electronically Signed   By: Tollie Eth M.D.   On: 08/31/2016 22:34   Ct Chest Wo Contrast  Result Date: 08/31/2016 CLINICAL DATA:  Fall with large swelling to the left flank EXAM: CT CHEST, ABDOMEN AND PELVIS WITHOUT CONTRAST TECHNIQUE: Multidetector CT imaging of the chest, abdomen and pelvis was performed following the standard protocol without IV contrast. COMPARISON:  Radiograph 08/15/2016, CT 08/15/2016 FINDINGS: CT CHEST FINDINGS Cardiovascular: Limited evaluation without the presence of intravenous contrast. Moderate atherosclerotic calcifications of the aorta. Post CABG changes. Coronary artery calcifications. Mild cardiomegaly. Partially visualized intracardiac pacing leads. No large pericardial effusion. Mediastinum/Nodes: Midline trachea. No thyroid mass. No gross mediastinal hematoma. No significantly enlarged mediastinal lymph nodes. Limited assessment for hilar nodes. Esophagus within normal limits Lungs/Pleura: No pneumothorax. Mild apical scarring. No focal consolidation. Trace right pleural effusion. Musculoskeletal: No acute displaced rib fracture. Mild superior endplate compression fracture of T11, uncertain acuity. CT ABDOMEN PELVIS FINDINGS Hepatobiliary: No focal hepatic abnormality. High density sludge or multiple small stones in the gallbladder. No biliary dilatation Pancreas: Unremarkable. No pancreatic ductal dilatation or surrounding inflammatory  changes. Spleen: Granuloma.  Otherwise negative. Adrenals/Urinary Tract: Adrenal glands are within normal limits. No hydronephrosis. The bladder is unremarkable. Stomach/Bowel: The stomach is nonenlarged. No dilated small bowel. No colon wall thickening. Sigmoid diverticular disease without acute inflammation. Vascular/Lymphatic: Aortic atherosclerosis. No enlarged abdominal or pelvic lymph nodes. Reproductive: Status post hysterectomy. No adnexal masses. Other: No free air or free fluid. Musculoskeletal: Lumbar spine demonstrates trace retrolisthesis of L5 on S1. Grade 1 anterolisthesis of L3 on L4 and L4 on L5. No acute osseous abnormality. Large hyperdense mass in the left flank area, this measures 10.1 cm AP x 5.6 cm transverse 13 cm craniocaudad. Hematocrit level is present. Edema surrounding the hyperdense mass. This would be consistent with a soft tissue hematoma. IMPRESSION: 1. Large 13 x 10.1 x 5.6 cm hyperdense mass within the left flank soft tissues extending from the level of the inferior ribs to the left iliac crest ; this would be consistent with a large soft tissue hematoma. No underlying left rib fracture. 2. Negative for acute thoracic injury allowing for absence of contrast. 3. No definite CT evidence for acute solid organ injury allowing for absence of contrast, no free air or free fluid 4. Age indeterminate superior endplate compression deformity of T11. 5. Hyperdense sludge or gallstones in the gallbladder Electronically Signed   By: Jasmine Pang M.D.   On: 08/31/2016 22:43   Ct Cervical Spine Wo Contrast  Result Date: 08/31/2016 CLINICAL DATA:  Patient fell backward against the computer chest. Pain. EXAM: CT HEAD WITHOUT CONTRAST CT CERVICAL SPINE WITHOUT  CONTRAST TECHNIQUE: Multidetector CT imaging of the head and cervical spine was performed following the standard protocol without intravenous contrast. Multiplanar CT image reconstructions of the cervical spine were also generated.  COMPARISON:  07/26/2016 FINDINGS: CT HEAD FINDINGS Brain: Chronic stable superficial and moderate central atrophy with moderate degree of small vessel ischemia. Small bilateral basal ganglial lacunar infarcts. No acute intracranial hemorrhage, midline shift or edema. No extra-axial fluid collections. No intra-axial mass lesions. No acute large vascular territory infarct. The fourth ventricle and basal cisterns are midline without effacement. Vascular: No hyperdense vessels. Atherosclerosis of the carotid siphons and right vertebral artery. Skull: No acute skull fracture nor primary osseous lesions. Sinuses/Orbits: Minimal mucosal thickening of the anterior ethmoid sinus. No acute sinusitis. Clear mastoids. Bilateral lens replacements. Intact orbits and globes. Other: Suggests a mild periorbital soft tissue swelling on the right. CT CERVICAL SPINE FINDINGS Alignment: Chronic stable appearing anterolisthesis of C3 on C4 by 2 mm, C4 on C5 by 4 mm and 2 mm of C5 on C6. Bilateral facet arthropathy without jumped appearing facets. Skull base and vertebrae: The craniocervical relationship is maintained. No skullbase fracture. No cervical vertebral body fracture bone destruction. Soft tissues and spinal canal: No prevertebral fluid or swelling. No visible canal hematoma. Disc levels: Chronic stable degenerative disc disease C4-5, C5-6 and C6-7 with hypertrophic facet arthropathy as before. Upper chest: Apical scarring is noted of the lung apices. Extracranial carotid arteriosclerosis. No evidence of thyroid mass. Other: None IMPRESSION: 1. Cerebral atrophy with chronic small vessel ischemic disease of white matter. No acute intracranial abnormality. 2. Similar appearance of the cervical spine with multilevel degenerative anterolisthesis of C4 through C7. No fracture or bone destruction. Electronically Signed   By: Tollie Eth M.D.   On: 08/31/2016 22:34   Ct Hip Left Wo Contrast  Result Date: 08/15/2016 CLINICAL DATA:   Left hip pain secondary to a fall this morning. EXAM: CT OF THE LEFT HIP WITHOUT CONTRAST TECHNIQUE: Multidetector CT imaging of the left hip was performed according to the standard protocol. Multiplanar CT image reconstructions were also generated. COMPARISON:  Radiographs dated 08/15/2016 FINDINGS: Bones/Joint/Cartilage There is no fracture. There is joint space narrowing medially and posteriorly in the left hip. 1 cm complex subcortical cyst in the anterior superior aspect of the left acetabulum. Soft tissues Calcific tendinopathy of the hamstring tendons originating from the left ischial tuberosity. No joint effusion or appreciable soft tissue contusion. Degenerative disc disease at L5-S1, incompletely visualized. Extensive diverticulosis of the sigmoid portion of the colon. Nonspecific free fluid in the pelvis of unknown etiology. IMPRESSION: No acute abnormality of the left hip. Osteoarthritic changes of the left hip. Ascites in the pelvis of unknown etiology. Sigmoid diverticulosis. Electronically Signed   By: Francene Boyers M.D.   On: 08/15/2016 15:46   Dg Hand Complete Left  Result Date: 08/15/2016 CLINICAL DATA:  Fall with bruising to the hand EXAM: LEFT HAND - COMPLETE 3+ VIEW COMPARISON:  None. FINDINGS: No subluxation or radiopaque foreign body. Calcification at the triangular fibrocartilage. Questionable nondisplaced fracture at the dorsal base of the middle phalanx on the lateral view, exact digit uncertain, possibly third digit. IMPRESSION: 1. Questionable nondisplaced fracture involving dorsal base of the middle phalanx on lateral view, possibly third digit. 2. Otherwise no acute osseous abnormality is seen. 3. Triangular fibrocartilage calcifications suggesting chondrocalcinosis Electronically Signed   By: Jasmine Pang M.D.   On: 08/15/2016 14:11   Dg Hand Complete Right  Result Date: 08/15/2016 CLINICAL DATA:  Collapsed  pain with bruising to both hands EXAM: RIGHT HAND - COMPLETE 3+ VIEW  COMPARISON:  None. FINDINGS: No definite acute displaced fracture or malalignment. Large amount of soft tissue swelling over the dorsum of the hand. No radiopaque foreign body. Calcification at the triangular fibrocartilage. Additional soft tissue calcification adjacent to the scaphoid. IMPRESSION: 1. Large amount of dorsal soft tissue swelling. No definite acute displaced fracture is seen. 2. Calcification at the triangular fibrocartilage suggesting chondrocalcinosis. Electronically Signed   By: Jasmine Pang M.D.   On: 08/15/2016 14:09   Dg Hip Unilat With Pelvis 2-3 Views Left  Result Date: 08/15/2016 CLINICAL DATA:  Fall with left hip pain EXAM: DG HIP (WITH OR WITHOUT PELVIS) 2-3V LEFT COMPARISON:  05/11/2016 FINDINGS: No acute displaced fracture or malalignment. The SI joints are symmetric. Pubic symphysis appears intact. Small clip inferior to the right ischium. Joint space is relatively maintained. IMPRESSION: No definite acute osseous abnormality. Electronically Signed   By: Jasmine Pang M.D.   On: 08/15/2016 14:13    Rashaad Hallstrom, DO  [facility] Pager [phone]  If 7PM-7AM, please contact night-coverage [email or web address] Password [email or web address] 09/01/2016, 10:02 AM   LOS: 0 days

## 2016-09-01 NOTE — Progress Notes (Signed)
Patient has not voided.  Bladder scan showed 300 cc.  On call Md notified and order for in and out obtained.  In and out cath resulted in 375 cc dark amber urine.  Patient tolerated well.  To bladder scan again in 4 hours.

## 2016-09-01 NOTE — Care Management Obs Status (Signed)
MEDICARE OBSERVATION STATUS NOTIFICATION   Patient Details  Name: Latoya RathkeMable M Feinstein MRN: [id number] Date of Birth: Jun 12, 1924   Medicare Observation Status Notification Given:  Yes    Malcolm MetroChildress, Adrianne Shackleton Demske, RN 09/01/2016, 3:02 PM

## 2016-09-01 NOTE — Care Management Note (Signed)
Case Management Note  Patient Details  Name: Latoya Crane MRN: [id number] Date of Birth: [birth year]  Subjective/Objective:                  Admitted after falling. [facility]. She has dementia at baseline. Pt's daughter Aram BeechamCynthia at the bedside. Per daughter she has fallen twice since being at ALF, daughter asking about higher level of care. Pt non-responsive at this time, all sedatives have been held. CSW aware pt coming from facility.   Action/Plan: CM will cont to follow.   Expected Discharge Date:        09/03/2016          Expected Discharge Plan:  Assisted Living / Rest Home  In-House Referral:  Clinical Social Work  Discharge planning Services  CM Consult  Post Acute Care Choice:  NA Choice offered to:  NA  Status of Service:  In process, will continue to follow  Malcolm MetroChildress, Elane Peabody Demske, RN 09/01/2016, 3:02 PM

## 2016-09-02 ENCOUNTER — Encounter (HOSPITAL_COMMUNITY): Payer: Self-pay | Admitting: Primary Care

## 2016-09-02 DIAGNOSIS — I4891 Unspecified atrial fibrillation: Secondary | ICD-10-CM

## 2016-09-02 DIAGNOSIS — R279 Unspecified lack of coordination: Secondary | ICD-10-CM | POA: Diagnosis not present

## 2016-09-02 DIAGNOSIS — Z950 Presence of cardiac pacemaker: Secondary | ICD-10-CM | POA: Diagnosis not present

## 2016-09-02 DIAGNOSIS — D62 Acute posthemorrhagic anemia: Secondary | ICD-10-CM | POA: Diagnosis not present

## 2016-09-02 DIAGNOSIS — Z7189 Other specified counseling: Secondary | ICD-10-CM | POA: Diagnosis not present

## 2016-09-02 DIAGNOSIS — T402X5A Adverse effect of other opioids, initial encounter: Secondary | ICD-10-CM | POA: Diagnosis present

## 2016-09-02 DIAGNOSIS — W19XXXA Unspecified fall, initial encounter: Secondary | ICD-10-CM | POA: Diagnosis not present

## 2016-09-02 DIAGNOSIS — Z7401 Bed confinement status: Secondary | ICD-10-CM | POA: Diagnosis not present

## 2016-09-02 DIAGNOSIS — Z951 Presence of aortocoronary bypass graft: Secondary | ICD-10-CM | POA: Diagnosis not present

## 2016-09-02 DIAGNOSIS — E039 Hypothyroidism, unspecified: Secondary | ICD-10-CM | POA: Diagnosis present

## 2016-09-02 DIAGNOSIS — R296 Repeated falls: Secondary | ICD-10-CM | POA: Diagnosis present

## 2016-09-02 DIAGNOSIS — I495 Sick sinus syndrome: Secondary | ICD-10-CM | POA: Diagnosis not present

## 2016-09-02 DIAGNOSIS — Y92129 Unspecified place in nursing home as the place of occurrence of the external cause: Secondary | ICD-10-CM | POA: Diagnosis not present

## 2016-09-02 DIAGNOSIS — Z7982 Long term (current) use of aspirin: Secondary | ICD-10-CM | POA: Diagnosis not present

## 2016-09-02 DIAGNOSIS — Z8673 Personal history of transient ischemic attack (TIA), and cerebral infarction without residual deficits: Secondary | ICD-10-CM | POA: Diagnosis not present

## 2016-09-02 DIAGNOSIS — Z90710 Acquired absence of both cervix and uterus: Secondary | ICD-10-CM | POA: Diagnosis not present

## 2016-09-02 DIAGNOSIS — Z66 Do not resuscitate: Secondary | ICD-10-CM | POA: Diagnosis present

## 2016-09-02 DIAGNOSIS — Z5189 Encounter for other specified aftercare: Secondary | ICD-10-CM

## 2016-09-02 DIAGNOSIS — N179 Acute kidney failure, unspecified: Secondary | ICD-10-CM | POA: Diagnosis present

## 2016-09-02 DIAGNOSIS — Z515 Encounter for palliative care: Secondary | ICD-10-CM

## 2016-09-02 DIAGNOSIS — I10 Essential (primary) hypertension: Secondary | ICD-10-CM | POA: Diagnosis present

## 2016-09-02 DIAGNOSIS — Z79891 Long term (current) use of opiate analgesic: Secondary | ICD-10-CM | POA: Diagnosis not present

## 2016-09-02 DIAGNOSIS — I2510 Atherosclerotic heart disease of native coronary artery without angina pectoris: Secondary | ICD-10-CM | POA: Diagnosis not present

## 2016-09-02 DIAGNOSIS — E785 Hyperlipidemia, unspecified: Secondary | ICD-10-CM | POA: Diagnosis present

## 2016-09-02 DIAGNOSIS — G92 Toxic encephalopathy: Secondary | ICD-10-CM | POA: Diagnosis not present

## 2016-09-02 DIAGNOSIS — I481 Persistent atrial fibrillation: Secondary | ICD-10-CM | POA: Diagnosis not present

## 2016-09-02 DIAGNOSIS — E860 Dehydration: Secondary | ICD-10-CM | POA: Diagnosis present

## 2016-09-02 DIAGNOSIS — S301XXD Contusion of abdominal wall, subsequent encounter: Secondary | ICD-10-CM | POA: Diagnosis not present

## 2016-09-02 DIAGNOSIS — R109 Unspecified abdominal pain: Secondary | ICD-10-CM | POA: Diagnosis present

## 2016-09-02 DIAGNOSIS — F419 Anxiety disorder, unspecified: Secondary | ICD-10-CM | POA: Diagnosis present

## 2016-09-02 DIAGNOSIS — F0391 Unspecified dementia with behavioral disturbance: Secondary | ICD-10-CM | POA: Diagnosis not present

## 2016-09-02 DIAGNOSIS — N289 Disorder of kidney and ureter, unspecified: Secondary | ICD-10-CM | POA: Diagnosis not present

## 2016-09-02 DIAGNOSIS — M1990 Unspecified osteoarthritis, unspecified site: Secondary | ICD-10-CM | POA: Diagnosis present

## 2016-09-02 DIAGNOSIS — S301XXA Contusion of abdominal wall, initial encounter: Secondary | ICD-10-CM | POA: Diagnosis not present

## 2016-09-02 DIAGNOSIS — W1839XA Other fall on same level, initial encounter: Secondary | ICD-10-CM | POA: Diagnosis present

## 2016-09-02 LAB — CBC
HCT: 32.9 % — ABNORMAL LOW (ref 36.0–46.0)
HEMOGLOBIN: 9.9 g/dL — AB (ref 12.0–15.0)
MCH: 26.2 pg (ref 26.0–34.0)
MCHC: 30.1 g/dL (ref 30.0–36.0)
MCV: 87 fL (ref 78.0–100.0)
Platelets: 365 10*3/uL (ref 150–400)
RBC: 3.78 MIL/uL — AB (ref 3.87–5.11)
RDW: 17.3 % — ABNORMAL HIGH (ref 11.5–15.5)
WBC: 11 10*3/uL — ABNORMAL HIGH (ref 4.0–10.5)

## 2016-09-02 LAB — BASIC METABOLIC PANEL
ANION GAP: 10 (ref 5–15)
BUN: 26 mg/dL — ABNORMAL HIGH (ref 6–20)
CALCIUM: 8.8 mg/dL — AB (ref 8.9–10.3)
CHLORIDE: 104 mmol/L (ref 101–111)
CO2: 25 mmol/L (ref 22–32)
CREATININE: 1.67 mg/dL — AB (ref 0.44–1.00)
GFR calc Af Amer: 30 mL/min — ABNORMAL LOW (ref >60)
GFR calc non Af Amer: 26 mL/min — ABNORMAL LOW (ref >60)
GLUCOSE: 170 mg/dL — AB (ref 65–99)
Potassium: 4.7 mmol/L (ref 3.5–5.1)
Sodium: 139 mmol/L (ref 135–145)

## 2016-09-02 LAB — MRSA PCR SCREENING: MRSA BY PCR: NEGATIVE

## 2016-09-02 MED ORDER — DILTIAZEM HCL 100 MG IV SOLR
5.0000 mg/h | INTRAVENOUS | Status: DC
  Administered 2016-09-02: 10 mg/h via INTRAVENOUS
  Filled 2016-09-02: qty 100

## 2016-09-02 MED ORDER — LORAZEPAM 1 MG PO TABS: 1.0000 mg | ORAL_TABLET | ORAL | Status: DC | PRN

## 2016-09-02 MED ORDER — SERTRALINE HCL 50 MG PO TABS: 25.0000 mg | ORAL_TABLET | Freq: Every day | ORAL | Status: DC

## 2016-09-02 MED ORDER — LORAZEPAM 2 MG/ML PO CONC
1.0000 mg | ORAL | Status: DC | PRN
  Filled 2016-09-02: qty 0.5

## 2016-09-02 MED ORDER — MORPHINE SULFATE (CONCENTRATE) 10 MG/0.5ML PO SOLN
2.5000 mg | ORAL | Status: DC | PRN
  Administered 2016-09-02 – 2016-09-03 (×4): 2.6 mg via ORAL
  Filled 2016-09-02 (×4): qty 0.5

## 2016-09-02 MED ORDER — LORAZEPAM 2 MG/ML IJ SOLN
1.0000 mg | INTRAMUSCULAR | Status: DC | PRN
  Administered 2016-09-03 – 2016-09-04 (×4): 1 mg via INTRAVENOUS
  Filled 2016-09-02 (×4): qty 1

## 2016-09-02 MED ORDER — MORPHINE SULFATE (PF) 2 MG/ML IV SOLN
2.0000 mg | INTRAVENOUS | Status: DC | PRN
  Administered 2016-09-03 (×2): 2 mg via INTRAVENOUS
  Filled 2016-09-02 (×2): qty 1

## 2016-09-02 MED ORDER — ATROPINE SULFATE 1 % OP SOLN
4.0000 [drp] | OPHTHALMIC | Status: DC | PRN
  Filled 2016-09-02: qty 2

## 2016-09-02 MED ORDER — HALOPERIDOL LACTATE 5 MG/ML IJ SOLN
2.0000 mg | Freq: Four times a day (QID) | INTRAMUSCULAR | Status: DC | PRN
  Administered 2016-09-02 (×2): 2 mg via INTRAVENOUS
  Filled 2016-09-02 (×2): qty 1

## 2016-09-02 MED ORDER — METOPROLOL TARTRATE 25 MG PO TABS
12.5000 mg | ORAL_TABLET | Freq: Two times a day (BID) | ORAL | Status: DC
  Administered 2016-09-02: 12.5 mg via ORAL
  Filled 2016-09-02: qty 1

## 2016-09-02 MED ORDER — FENTANYL CITRATE (PF) 100 MCG/2ML IJ SOLN
12.5000 ug | INTRAMUSCULAR | Status: DC | PRN
  Administered 2016-09-02 (×2): 12.5 ug via INTRAVENOUS
  Filled 2016-09-02 (×2): qty 2

## 2016-09-02 MED ORDER — HYDROCODONE-ACETAMINOPHEN 5-325 MG PO TABS: 1.0000 | ORAL_TABLET | Freq: Four times a day (QID) | ORAL | Status: DC | PRN

## 2016-09-02 NOTE — Progress Notes (Signed)
**Note De-Identified  Obfuscation** STAT EKG completed; RN notified and placed in patient chart

## 2016-09-02 NOTE — Progress Notes (Signed)
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 The patient has had increasing leg frequent falls in the past 2-3 months. The patient has had ED visits resulted from complications of falls on 05/11/2016, 07/26/2016, and 08/15/2016. The patient was taken off of warfarin after her last ED visit on 08/15/2016. She was started on aspirin. According to the patient's daughter, the patient has had increasing instability with her gait and lower extremity weakness resulting in more difficulty with ambulation in the past. 4 days. In addition, the patient has had poor oral intake. There is no reports of vomiting, diarrhea, chest pain, respiratory distress. Initial evaluation in the emergency department including CT of the cervical spine, chest, and abdomen did not reveal any traumatic injuries. CT of the abdomen showed a  large left flank hematoma measuring 10.1 x 5.6 x 13 cm.  Assessment/Plan: Acute blood loss anemia -Secondary to large left flank hematoma -Hemoglobin 13.9>>>10.3>>9.9 -Check coags--INR 1.36 -Trend hemoglobin -Remains hemodynamically stable  Persistent Atrial fibrillation with RVR -developed RVR am 09/02/16--in part due to ABLA and also inability to take po meds -start diltiazem drip -transfer to step down -personally reviewed EKG--Afib RVR with rate related ST depression -CHADS-VASc = 4--poor candidate for [facility] due to frequent falls with bleeding complications  Dehydration -increasing serum creatinine -due to decrease po intake -start IVF -am BMP  Acute toxic encephalopathy -The patient is very somnolent on examination  09/01/2016 -Likely secondary to a combination of opioids in the setting of renal dysfunction--patient was given 2 x morphine 4mg  and hydrocodone -d/c IV opioids, hydrocodone -tramadol for severe pain for now--re-evaluate when pt is more alert -UA neg for pyuria -CT brain--neg for acute findings -Discontinue alprazolam -Switched to IV medications until the patient is more alert and able to take po safely  Large left flank hematoma -Secondary to mechanical fall -Holding ASA -CT cervical spine negative for fracture or dislocation -fentanyl prn severe pain  Dementia with behavioral disturbance -Continue Zoloft, Zyprexa when patient able to take po safely -haldol prn agitation  Coronary artery disease -Continue Imdur -holding ASA due to ABLA  Tachybradycardia syndrome -Status post permanent pacemaker -follow Dr. Hillis Range  Renal failure of unknown chronicity -monitor BMP  Goals of Care -DNR -consult palliative medicine   Disposition Plan:   ALF in 1-2 days  Family Communication:   Daughter updated at bedside 6/8  Consultants:  Palliative medicine  Code Status:  DNR  DVT Prophylaxis:  SCD   Procedures: As Listed in Progress Note Above  Antibiotics: None    Subjective: Patient is more alert this morning. She is more agitated. She is occasionally redirectable. She has tried to pull out her IVs and interfere with medical treatment. No reports of respiratory distress, uncontrolled pain, vomiting, diarrhea.  Objective: Vitals:   09/01/16 1300 09/01/16 2132 09/02/16 0516 09/02/16 1200  BP: 116/74 (!) 148/74 92/66   Pulse: 89 88 (!) 118   Resp: 18 20 20    Temp:  97.9 F (36.6 C) 98.3 F (36.8 C) 97.9 F (36.6 C)  TempSrc:  Oral Axillary Axillary  SpO2: 93% 100% 98%   Weight:        Intake/Output  Summary (Last 24 hours) at 09/02/16 1233 Last data filed at 09/02/16 1000  Gross per 24 hour  Intake           556.67 ml  Output              375 ml  Net            181.67 ml   Weight change:  Exam:   General:  Pt is alert, follows commands appropriately, not in acute distress  HEENT: No icterus, No thrush, No neck mass, [place]/AT  Cardiovascular: RRR, S1/S2, no rubs, no gallops  Respiratory: Poor inspiratory effort. Bibasilar crackles. No wheezing.  Abdomen: Soft/+BS, non tender, non distended, no guarding; large left flank hematoma extending from the patient's inferior ribs to the pelvic iliac crest on the left.  Extremities: No edema, No lymphangitis, No petechiae, No rashes, no synovitis   Data Reviewed: I have personally reviewed following labs and imaging studies Basic Metabolic Panel:  Recent Labs Lab 08/31/16 2112 09/02/16 1043  NA 139 139  K 4.1 4.7  CL 102 104  CO2 25 25  GLUCOSE 102* 170*  BUN 30* 26*  CREATININE 1.50* 1.67*  CALCIUM 9.3 8.8*   Liver Function Tests:  Recent Labs Lab 08/31/16 2112  AST 43*  ALT 36  ALKPHOS 136*  BILITOT 1.6*  PROT 7.7  ALBUMIN 4.2   No results for input(s): LIPASE, AMYLASE in the last 168 hours. No results for input(s): AMMONIA in the last 168 hours. Coagulation Profile:  Recent Labs Lab 08/31/16 2112  INR 1.36   CBC:  Recent Labs Lab 08/31/16 2112 09/01/16 0626 09/02/16 1043  WBC 11.5* 9.3 11.0*  NEUTROABS 7.6  --   --   HGB 13.9 10.3* 9.9*  HCT 45.3 33.8* 32.9*  MCV 85.8 86.2 87.0  PLT 403* 308 365   Cardiac Enzymes: No results for input(s): CKTOTAL, CKMB, CKMBINDEX, TROPONINI in the last 168 hours. BNP: Invalid input(s): POCBNP CBG: No results for input(s): GLUCAP in the last 168 hours. HbA1C: No results for input(s): HGBA1C in the last 72 hours. Urine analysis:    Component Value Date/Time   COLORURINE YELLOW 08/31/2016 2246   APPEARANCEUR CLEAR 08/31/2016 2246   LABSPEC 1.020 08/31/2016 2246   PHURINE 5.0 08/31/2016 2246   GLUCOSEU NEGATIVE 08/31/2016 2246   HGBUR NEGATIVE 08/31/2016 2246   BILIRUBINUR NEGATIVE 08/31/2016 2246    KETONESUR NEGATIVE 08/31/2016 2246   PROTEINUR 100 (A) 08/31/2016 2246   UROBILINOGEN 1.0 09/19/2009 1504   NITRITE NEGATIVE 08/31/2016 2246   LEUKOCYTESUR NEGATIVE 08/31/2016 2246   Sepsis Labs: @LABRCNTIP (procalcitonin:4,lacticidven:4) ) Recent Results (from the past 240 hour(s))  MRSA PCR Screening     Status: None   Collection Time: 09/01/16  1:55 AM  Result Value Ref Range Status   MRSA by PCR NEGATIVE NEGATIVE Final    Comment:        The GeneXpert MRSA Assay (FDA approved for NASAL specimens only), is one component of a comprehensive MRSA colonization surveillance program. It is not intended to diagnose MRSA infection nor to guide or monitor treatment for MRSA infections.      Scheduled Meds: . levothyroxine  37.5 mcg Intravenous Daily  . metoprolol tartrate  12.5 mg Oral BID  . OLANZapine  2.5 mg Oral QHS  . sodium chloride flush  3 mL Intravenous Q12H   Continuous Infusions: . sodium chloride    . 0.9 % NaCl with KCl 20 mEq / L 50 mL/hr at 09/01/16 1807  .  diltiazem (CARDIZEM) infusion      Procedures/Studies: Ct Abdomen Pelvis Wo Contrast  Result Date: 08/31/2016 CLINICAL DATA:  Fall with large swelling to the left flank EXAM: CT CHEST, ABDOMEN AND PELVIS WITHOUT CONTRAST TECHNIQUE: Multidetector CT imaging of the chest, abdomen and pelvis was performed following the standard protocol without IV contrast. COMPARISON:  Radiograph 08/15/2016, CT 08/15/2016 FINDINGS: CT CHEST FINDINGS Cardiovascular: Limited evaluation without the presence of intravenous contrast. Moderate atherosclerotic calcifications of the aorta. Post CABG changes. Coronary artery calcifications. Mild cardiomegaly. Partially visualized intracardiac pacing leads. No large pericardial effusion. Mediastinum/Nodes: Midline trachea. No thyroid mass. No gross mediastinal hematoma. No significantly enlarged mediastinal lymph nodes. Limited assessment for hilar nodes. Esophagus within normal limits  Lungs/Pleura: No pneumothorax. Mild apical scarring. No focal consolidation. Trace right pleural effusion. Musculoskeletal: No acute displaced rib fracture. Mild superior endplate compression fracture of T11, uncertain acuity. CT ABDOMEN PELVIS FINDINGS Hepatobiliary: No focal hepatic abnormality. High density sludge or multiple small stones in the gallbladder. No biliary dilatation Pancreas: Unremarkable. No pancreatic ductal dilatation or surrounding inflammatory changes. Spleen: Granuloma.  Otherwise negative. Adrenals/Urinary Tract: Adrenal glands are within normal limits. No hydronephrosis. The bladder is unremarkable. Stomach/Bowel: The stomach is nonenlarged. No dilated small bowel. No colon wall thickening. Sigmoid diverticular disease without acute inflammation. Vascular/Lymphatic: Aortic atherosclerosis. No enlarged abdominal or pelvic lymph nodes. Reproductive: Status post hysterectomy. No adnexal masses. Other: No free air or free fluid. Musculoskeletal: Lumbar spine demonstrates trace retrolisthesis of L5 on S1. Grade 1 anterolisthesis of L3 on L4 and L4 on L5. No acute osseous abnormality. Large hyperdense mass in the left flank area, this measures 10.1 cm AP x 5.6 cm transverse 13 cm craniocaudad. Hematocrit level is present. Edema surrounding the hyperdense mass. This would be consistent with a soft tissue hematoma. IMPRESSION: 1. Large 13 x 10.1 x 5.6 cm hyperdense mass within the left flank soft tissues extending from the level of the inferior ribs to the left iliac crest ; this would be consistent with a large soft tissue hematoma. No underlying left rib fracture. 2. Negative for acute thoracic injury allowing for absence of contrast. 3. No definite CT evidence for acute solid organ injury allowing for absence of contrast, no free air or free fluid 4. Age indeterminate superior endplate compression deformity of T11. 5. Hyperdense sludge or gallstones in the gallbladder Electronically Signed   By:  Jasmine Pang M.D.   On: 08/31/2016 22:43   Dg Chest 1 View  Result Date: 08/15/2016 CLINICAL DATA:  Collapsed in hallway at nursing facility. LEFT hip pain. Fissure dementia. EXAM: CHEST 1 VIEW COMPARISON:  Chest radiograph September 30, 2015 FINDINGS: The cardiac silhouette is mildly enlarged. Status post median sternotomy for CABG. Dual lead LEFT cardiac pacemaker in situ. Mild calcific atherosclerosis of the aortic arch. Diffuse interstitial prominence without pleural effusion or focal consolidation. No pneumothorax. Osteopenia. IMPRESSION: Mild interstitial prominence, increased from prior examination can be seen with pulmonary edema, atypical infection. Mild cardiomegaly. Electronically Signed   By: Awilda Metro M.D.   On: 08/15/2016 14:08   Ct Head Wo Contrast  Result Date: 08/31/2016 CLINICAL DATA:  Patient fell backward against the computer chest. Pain. EXAM: CT HEAD WITHOUT CONTRAST CT CERVICAL SPINE WITHOUT CONTRAST TECHNIQUE: Multidetector CT imaging of the head and cervical spine was performed following the standard protocol without intravenous contrast. Multiplanar CT image reconstructions of the cervical spine were also generated. COMPARISON:  07/26/2016 FINDINGS: CT HEAD FINDINGS Brain: Chronic stable superficial and moderate central  atrophy with moderate degree of small vessel ischemia. Small bilateral basal ganglial lacunar infarcts. No acute intracranial hemorrhage, midline shift or edema. No extra-axial fluid collections. No intra-axial mass lesions. No acute large vascular territory infarct. The fourth ventricle and basal cisterns are midline without effacement. Vascular: No hyperdense vessels. Atherosclerosis of the carotid siphons and right vertebral artery. Skull: No acute skull fracture nor primary osseous lesions. Sinuses/Orbits: Minimal mucosal thickening of the anterior ethmoid sinus. No acute sinusitis. Clear mastoids. Bilateral lens replacements. Intact orbits and globes. Other:  Suggests a mild periorbital soft tissue swelling on the right. CT CERVICAL SPINE FINDINGS Alignment: Chronic stable appearing anterolisthesis of C3 on C4 by 2 mm, C4 on C5 by 4 mm and 2 mm of C5 on C6. Bilateral facet arthropathy without jumped appearing facets. Skull base and vertebrae: The craniocervical relationship is maintained. No skullbase fracture. No cervical vertebral body fracture bone destruction. Soft tissues and spinal canal: No prevertebral fluid or swelling. No visible canal hematoma. Disc levels: Chronic stable degenerative disc disease C4-5, C5-6 and C6-7 with hypertrophic facet arthropathy as before. Upper chest: Apical scarring is noted of the lung apices. Extracranial carotid arteriosclerosis. No evidence of thyroid mass. Other: None IMPRESSION: 1. Cerebral atrophy with chronic small vessel ischemic disease of white matter. No acute intracranial abnormality. 2. Similar appearance of the cervical spine with multilevel degenerative anterolisthesis of C4 through C7. No fracture or bone destruction. Electronically Signed   By: Tollie Eth M.D.   On: 08/31/2016 22:34   Ct Chest Wo Contrast  Result Date: 08/31/2016 CLINICAL DATA:  Fall with large swelling to the left flank EXAM: CT CHEST, ABDOMEN AND PELVIS WITHOUT CONTRAST TECHNIQUE: Multidetector CT imaging of the chest, abdomen and pelvis was performed following the standard protocol without IV contrast. COMPARISON:  Radiograph 08/15/2016, CT 08/15/2016 FINDINGS: CT CHEST FINDINGS Cardiovascular: Limited evaluation without the presence of intravenous contrast. Moderate atherosclerotic calcifications of the aorta. Post CABG changes. Coronary artery calcifications. Mild cardiomegaly. Partially visualized intracardiac pacing leads. No large pericardial effusion. Mediastinum/Nodes: Midline trachea. No thyroid mass. No gross mediastinal hematoma. No significantly enlarged mediastinal lymph nodes. Limited assessment for hilar nodes. Esophagus within  normal limits Lungs/Pleura: No pneumothorax. Mild apical scarring. No focal consolidation. Trace right pleural effusion. Musculoskeletal: No acute displaced rib fracture. Mild superior endplate compression fracture of T11, uncertain acuity. CT ABDOMEN PELVIS FINDINGS Hepatobiliary: No focal hepatic abnormality. High density sludge or multiple small stones in the gallbladder. No biliary dilatation Pancreas: Unremarkable. No pancreatic ductal dilatation or surrounding inflammatory changes. Spleen: Granuloma.  Otherwise negative. Adrenals/Urinary Tract: Adrenal glands are within normal limits. No hydronephrosis. The bladder is unremarkable. Stomach/Bowel: The stomach is nonenlarged. No dilated small bowel. No colon wall thickening. Sigmoid diverticular disease without acute inflammation. Vascular/Lymphatic: Aortic atherosclerosis. No enlarged abdominal or pelvic lymph nodes. Reproductive: Status post hysterectomy. No adnexal masses. Other: No free air or free fluid. Musculoskeletal: Lumbar spine demonstrates trace retrolisthesis of L5 on S1. Grade 1 anterolisthesis of L3 on L4 and L4 on L5. No acute osseous abnormality. Large hyperdense mass in the left flank area, this measures 10.1 cm AP x 5.6 cm transverse 13 cm craniocaudad. Hematocrit level is present. Edema surrounding the hyperdense mass. This would be consistent with a soft tissue hematoma. IMPRESSION: 1. Large 13 x 10.1 x 5.6 cm hyperdense mass within the left flank soft tissues extending from the level of the inferior ribs to the left iliac crest ; this would be consistent with a large soft tissue hematoma.  No underlying left rib fracture. 2. Negative for acute thoracic injury allowing for absence of contrast. 3. No definite CT evidence for acute solid organ injury allowing for absence of contrast, no free air or free fluid 4. Age indeterminate superior endplate compression deformity of T11. 5. Hyperdense sludge or gallstones in the gallbladder Electronically  Signed   By: Jasmine Pang M.D.   On: 08/31/2016 22:43   Ct Cervical Spine Wo Contrast  Result Date: 08/31/2016 CLINICAL DATA:  Patient fell backward against the computer chest. Pain. EXAM: CT HEAD WITHOUT CONTRAST CT CERVICAL SPINE WITHOUT CONTRAST TECHNIQUE: Multidetector CT imaging of the head and cervical spine was performed following the standard protocol without intravenous contrast. Multiplanar CT image reconstructions of the cervical spine were also generated. COMPARISON:  07/26/2016 FINDINGS: CT HEAD FINDINGS Brain: Chronic stable superficial and moderate central atrophy with moderate degree of small vessel ischemia. Small bilateral basal ganglial lacunar infarcts. No acute intracranial hemorrhage, midline shift or edema. No extra-axial fluid collections. No intra-axial mass lesions. No acute large vascular territory infarct. The fourth ventricle and basal cisterns are midline without effacement. Vascular: No hyperdense vessels. Atherosclerosis of the carotid siphons and right vertebral artery. Skull: No acute skull fracture nor primary osseous lesions. Sinuses/Orbits: Minimal mucosal thickening of the anterior ethmoid sinus. No acute sinusitis. Clear mastoids. Bilateral lens replacements. Intact orbits and globes. Other: Suggests a mild periorbital soft tissue swelling on the right. CT CERVICAL SPINE FINDINGS Alignment: Chronic stable appearing anterolisthesis of C3 on C4 by 2 mm, C4 on C5 by 4 mm and 2 mm of C5 on C6. Bilateral facet arthropathy without jumped appearing facets. Skull base and vertebrae: The craniocervical relationship is maintained. No skullbase fracture. No cervical vertebral body fracture bone destruction. Soft tissues and spinal canal: No prevertebral fluid or swelling. No visible canal hematoma. Disc levels: Chronic stable degenerative disc disease C4-5, C5-6 and C6-7 with hypertrophic facet arthropathy as before. Upper chest: Apical scarring is noted of the lung apices.  Extracranial carotid arteriosclerosis. No evidence of thyroid mass. Other: None IMPRESSION: 1. Cerebral atrophy with chronic small vessel ischemic disease of white matter. No acute intracranial abnormality. 2. Similar appearance of the cervical spine with multilevel degenerative anterolisthesis of C4 through C7. No fracture or bone destruction. Electronically Signed   By: Tollie Eth M.D.   On: 08/31/2016 22:34   Ct Hip Left Wo Contrast  Result Date: 08/15/2016 CLINICAL DATA:  Left hip pain secondary to a fall this morning. EXAM: CT OF THE LEFT HIP WITHOUT CONTRAST TECHNIQUE: Multidetector CT imaging of the left hip was performed according to the standard protocol. Multiplanar CT image reconstructions were also generated. COMPARISON:  Radiographs dated 08/15/2016 FINDINGS: Bones/Joint/Cartilage There is no fracture. There is joint space narrowing medially and posteriorly in the left hip. 1 cm complex subcortical cyst in the anterior superior aspect of the left acetabulum. Soft tissues Calcific tendinopathy of the hamstring tendons originating from the left ischial tuberosity. No joint effusion or appreciable soft tissue contusion. Degenerative disc disease at L5-S1, incompletely visualized. Extensive diverticulosis of the sigmoid portion of the colon. Nonspecific free fluid in the pelvis of unknown etiology. IMPRESSION: No acute abnormality of the left hip. Osteoarthritic changes of the left hip. Ascites in the pelvis of unknown etiology. Sigmoid diverticulosis. Electronically Signed   By: Francene Boyers M.D.   On: 08/15/2016 15:46   Dg Hand Complete Left  Result Date: 08/15/2016 CLINICAL DATA:  Fall with bruising to the hand EXAM: LEFT HAND -  COMPLETE 3+ VIEW COMPARISON:  None. FINDINGS: No subluxation or radiopaque foreign body. Calcification at the triangular fibrocartilage. Questionable nondisplaced fracture at the dorsal base of the middle phalanx on the lateral view, exact digit uncertain, possibly  third digit. IMPRESSION: 1. Questionable nondisplaced fracture involving dorsal base of the middle phalanx on lateral view, possibly third digit. 2. Otherwise no acute osseous abnormality is seen. 3. Triangular fibrocartilage calcifications suggesting chondrocalcinosis Electronically Signed   By: Jasmine PangKim  Fujinaga M.D.   On: 08/15/2016 14:11   Dg Hand Complete Right  Result Date: 08/15/2016 CLINICAL DATA:  Collapsed pain with bruising to both hands EXAM: RIGHT HAND - COMPLETE 3+ VIEW COMPARISON:  None. FINDINGS: No definite acute displaced fracture or malalignment. Large amount of soft tissue swelling over the dorsum of the hand. No radiopaque foreign body. Calcification at the triangular fibrocartilage. Additional soft tissue calcification adjacent to the scaphoid. IMPRESSION: 1. Large amount of dorsal soft tissue swelling. No definite acute displaced fracture is seen. 2. Calcification at the triangular fibrocartilage suggesting chondrocalcinosis. Electronically Signed   By: Jasmine PangKim  Fujinaga M.D.   On: 08/15/2016 14:09   Dg Hip Unilat With Pelvis 2-3 Views Left  Result Date: 08/15/2016 CLINICAL DATA:  Fall with left hip pain EXAM: DG HIP (WITH OR WITHOUT PELVIS) 2-3V LEFT COMPARISON:  05/11/2016 FINDINGS: No acute displaced fracture or malalignment. The SI joints are symmetric. Pubic symphysis appears intact. Small clip inferior to the right ischium. Joint space is relatively maintained. IMPRESSION: No definite acute osseous abnormality. Electronically Signed   By: Jasmine PangKim  Fujinaga M.D.   On: 08/15/2016 14:13    Nadalie Laughner, DO  [facility] Pager [phone]  If 7PM-7AM, please contact night-coverage [email or web address] Password [email or web address] 09/02/2016, 12:33 PM   LOS: 0 days

## 2016-09-02 NOTE — Progress Notes (Signed)
PT Cancellation Note  Patient Details Name: Latoya Crane MRN: [id number] DOB: 13-Mar-1925   Cancelled Treatment:    Reason Eval/Treat Not Completed: PT screened, no needs identified, will sign off.  Chart reviewed/RN consulted; treatment is attempted, however patient is agitated, somnolent, and disoriented. History and PLOF are collected from daughter. The patient is intermittently total care, intermittent difficulty recognizing family members, and typically able to perform AMB ad lib in facility, but with  poor compliance with RW (new device), all of which is typical of this stage of dementia. Pt will benefit from continued regular mobility to decrease complications and comorbidities associated with bed rest, however pt will need supervision and CGA for all mobility moving forward. The patient may benefit from [place] upon return to facility, mostly for caregiver education, however falls risk is not anticipated to be decreased by PT at this stage of dementia. Daughter Marcelline Mates reports increasing falls since admitted to [facility] in February. PT recommending 24/7 supervision, and contact guard assist for all AMB and transfers. The patient's ability to participate with PT has not been established at this time due to disorientation and agitation. No skilled PT services needed at this time. All needs can be met at the next venue of care. PT signing off.   11:41 AM, 09/02/16 Etta Grandchild, PT, DPT Physical Therapist - [place] [phone] (Office)   Buccola,Allan C 09/02/2016, 11:33 AM

## 2016-09-02 NOTE — Progress Notes (Signed)
Lab unable to get blood from patient this morning.  Will come back to try after patients family arrives.  Will pass to the next nurse.

## 2016-09-02 NOTE — Consult Note (Signed)
Consultation Note Date: 09/02/2016   Patient Name: Latoya Crane  DOB: 01-14-1925  MRN: [id number]  Age / Sex: 83 y.o., female  PCP: Sunday Spillers, NP Referring Physician: Catarina Hartshorn, MD  Reason for Consultation: Establishing goals of care and Psychosocial/spiritual support  HPI/Patient Profile: 83 y.o. female  with past medical history of Dementia, Atrial fibrillation no longer on anticoagulant due to falls, coronary artery disease, high blood pressure and cholesterol, hypothyroidism, techie Brady syndrome, history of TIA, admitted for observation on 08/31/2016 with large left flank hematoma causing a drop in hemoglobin, atrial fibrillation with RVR.   Clinical Assessment and Goals of Care: Mrs. Hipolito is lying in bed. She is unable to make or keep eye contact, she is unable to tell me her name. Present at bedside today is daughter Adam Phenix. Ms. Iglesia continues to stay, "help me, help me". She is trying to get out of bed. Cherrie and I talk about what Mrs. Christians's chronic and acute health concerns, and briefly discuss goals. We set up a family meeting for today at 1 PM.  Meeting later in the day with daughter Volney American, Adam Phenix, and Dola. We talk about Ms. Degan's chronic and acute health problems in detail. We talk about her functional status and home life. Nettie Elm moved in with her parents approximately 27 years ago. Mrs. Deramo moved in with Leona 3 years ago. Ms. Mcgilvray husband died in 2022-12-10. Cynthia's husband died at the hospice home of [facility] of this year. Family shares Ms. Klingler's mental and physical to declines including decreased appetite over the last few weeks, although she has excellent albumin.   I share a diagram of the chronic illness pathway, we talk about the complications for people with dementia. We  talk at length about the chronic illness pathway for dementia including mobility changes, nutritional changes, and mental state changes. Family states that Mrs. Salberg has been at [facility] in the dementia care unit since February of this year. She has fallen multiple times since her admission, with 3 ED visits. She has a history of atrial fibrillation, and is now in a fib with RVR.  We talk about possible outcomes. Family states they are ready to focus on comfort and dignity, let nature take its course. The are requesting transition to hospice home of [facility]. Family is aware that only medications related to comfort and dignity will be provided, no IV fluids, no Cardizem drip. Family is requesting no more lab draws, no more testing. Conference with hospitalist, Dr. Arbutus Leas. He agrees that inpatient hospice is appropriate.   Symptom management per hospice protocol.  Healthcare power of attorney HCPOA - daughter Volney American is healthcare and durable power of attorney.  Daughter Donne Anon is 2nd.  2 other children, daughters Jose Persia and Nettie Elm.  SUMMARY OF RECOMMENDATIONS   Due to advanced age, decreasing functional status, recurrent falls in frailty, family is requesting placement in hospice home of [facility] for death with comfort and dignity.  Code Status/Advance  Care Planning:  DNR  Symptom Management:   per hospitalist, no additional needs at this time.    Symptom management per hospice protocol upon DC.  Palliative Prophylaxis:   Frequent Pain Assessment and Turn Reposition  Additional Recommendations (Limitations, Scope, Preferences):  Full Comfort Care  Psycho-social/Spiritual:   Desire for further Chaplaincy support:no  Additional Recommendations: Caregiving  Support/Resources and Education on Hospice  Prognosis:   < 4 weeks, would not be surprising based on 3 falls plus within the last 4 months, frailty, functional decline, dementia, a fib  with RVR, and families desire to focus on comfort and dignity.  Discharge Planning: Family is requesting inpatient admission to hospice home of [facility] for death with comfort and dignity.      Primary Diagnoses: Present on Admission: . Hematoma of left flank . ATRIAL FIBRILLATION . Pacemaker-St.Jude . Fall . Dementia . Tachycardia-bradycardia syndrome (HCC) . CAD, NATIVE VESSEL   I have reviewed the medical record, interviewed the patient and family, and examined the patient. The following aspects are pertinent.  Past Medical History:  Diagnosis Date  . Anxiety   . Atrial fibrillation (HCC)    CHADS2 score 4, on Coumadin  . Coronary artery disease    Multivessel, BMS prox LAD 06/11,subsequent CABG, LVEF 65%  . Hyperlipidemia   . Hypertension   . Hypothyroidism   . Osteoarthritis   . Tachycardia-bradycardia syndrome (HCC)    s/p PPM  . TIA (transient ischemic attack)    Social History   Social History  . Marital status: Married    Spouse name: N/A  . Number of children: N/A  . Years of education: N/A   Social History Main Topics  . Smoking status: Never Smoker  . Smokeless tobacco: Never Used  . Alcohol use No  . Drug use: Unknown  . Sexual activity: Not Asked   Other Topics Concern  . None   Social History Narrative   Lives near [place]   Family History  Problem Relation Age of Onset  . Coronary artery disease Neg Hx    Scheduled Meds: . levothyroxine  37.5 mcg Intravenous Daily  . metoprolol tartrate  12.5 mg Oral BID  . OLANZapine  2.5 mg Oral QHS  . sodium chloride flush  3 mL Intravenous Q12H   Continuous Infusions: . sodium chloride    . 0.9 % NaCl with KCl 20 mEq / L 50 mL/hr at 09/01/16 1807  . diltiazem (CARDIZEM) infusion     PRN Meds:.sodium chloride, haloperidol lactate, metoprolol tartrate, morphine injection, ondansetron **OR** ondansetron (ZOFRAN) IV, sodium chloride flush Medications Prior to Admission:  Prior to  Admission medications   Medication Sig Start Date End Date Taking? Authorizing Provider  ALPRAZolam (XANAX) 0.25 MG tablet Take 0.025 mg by mouth 2 (two) times daily as needed for anxiety.  03/18/14  Yes [provider]  aspirin EC 81 MG tablet Take 81 mg by mouth daily.   Yes [provider]  diltiazem (CARDIZEM CD) 120 MG 24 hr capsule Take 120 mg by mouth daily.    Yes [provider]  furosemide (LASIX) 40 MG tablet Take 20 mg by mouth daily.   Yes [provider]  HYDROcodone-acetaminophen (NORCO/VICODIN) 5-325 MG tablet Take 0.5 tablets by mouth 3 (three) times daily.    Yes [provider]  isosorbide mononitrate (ISMO,MONOKET) 10 MG tablet Take 10 mg by mouth daily.   Yes [provider]  levothyroxine (SYNTHROID, LEVOTHROID) 75 MCG tablet Take 75 mcg by  mouth daily before breakfast.   Yes [provider]  metoprolol tartrate (LOPRESSOR) 25 MG tablet Take 12.5 mg by mouth 2 (two) times daily.     Yes [provider]  Multiple Vitamins-Minerals (PRESERVISION AREDS 2 PO) Take 1 tablet by mouth 2 (two) times daily.   Yes [provider]  OLANZapine (ZYPREXA) 2.5 MG tablet Take 2.5 mg by mouth at bedtime.    Yes [provider]  potassium chloride SA (K-DUR,KLOR-CON) 20 MEQ tablet Take 1 tablet by mouth Daily. 01/26/11  Yes [provider]  ranitidine (ZANTAC) 150 MG capsule Take 150 mg by mouth at bedtime.    Yes [provider]  senna-docusate (SENOKOT-S) 8.6-50 MG tablet Take 1 tablet by mouth daily.   Yes [provider]  sertraline (ZOLOFT) 25 MG tablet Take 25 mg by mouth daily.   Yes [provider]   No Known Allergies Review of Systems  Unable to perform ROS: Dementia    Physical Exam  Constitutional: She appears distressed.  Appears pale, frail, unable to follow commands  HENT:  Head: Normocephalic and atraumatic.  Cardiovascular:  A fib,  Rate  110-120'a  Pulmonary/Chest: Effort normal. No respiratory distress.  Abdominal: Soft. She exhibits no distension.  Musculoskeletal: She exhibits no edema.  Neurological: She is alert.  Known dementia, unable to tell me her name  Skin: Skin is warm and dry.  Large bruise left flank  Nursing note and vitals reviewed.   Vital Signs: BP 92/66 (BP Location: Left Leg)   Pulse (!) 118   Temp 97.9 F (36.6 C) (Axillary)   Resp 20   Wt 49.9 kg (110 lb 0.2 oz)   SpO2 98%   BMI 21.48 kg/m  Pain Assessment: No/denies pain (pt states she isn't in pain)   Pain Score: Asleep   SpO2: SpO2: 98 % O2 Device:SpO2: 98 % O2 Flow Rate: .   IO: Intake/output summary:  Intake/Output Summary (Last 24 hours) at 09/02/16 1232 Last data filed at 09/02/16 1000  Gross per 24 hour  Intake           556.67 ml  Output              375 ml  Net           181.67 ml    LBM: Last BM Date:  (UTA) Baseline Weight: Weight: 49.9 kg (110 lb 0.2 oz) Most recent weight: Weight: 49.9 kg (110 lb 0.2 oz)     Palliative Assessment/Data:   Flowsheet Rows     Most Recent Value  Intake Tab  Referral Department  Hospitalist  Unit at Time of Referral  Med/Surg Unit  Palliative Care Primary Diagnosis  Trauma  Date Notified  09/01/16  Palliative Care Type  New Palliative care  Reason for referral  Clarify Goals of Care  Date of Admission  08/31/16  Date first seen by Palliative Care  09/02/16  # of days Palliative referral response time  1 Day(s)  # of days IP prior to Palliative referral  1  Clinical Assessment  Palliative Performance Scale Score  20%  Pain Max last 24 hours  Not able to report  Pain Min Last 24 hours  Not able to report  Dyspnea Max Last 24 Hours  Not able to report  Dyspnea Min Last 24 hours  Not able to report  Psychosocial & Spiritual Assessment  Palliative Care Outcomes  Patient/Family meeting held?  Yes  Who was at the meeting?  Daughter Cherry at bedside, family meeting scheduled  for 1 PM  Palliative Care Outcomes  Provided psychosocial or spiritual support  Patient/Family wishes: Interventions discontinued/not started   Mechanical Ventilation      Time In:    1130    1305 Time Out: 1150    1420 Time Total: 20 + 75 = 95 minutes total Greater than 50%  of this time was spent counseling and coordinating care related to the above assessment and plan.  Signed by: Katheran Aweasha A Rhina Kramme, NP   Please contact Palliative Medicine Team phone at [phone] for questions and concerns.  For individual provider: See Loretha StaplerAmion

## 2016-09-02 NOTE — Progress Notes (Signed)
Patient still has not voided.  Bladder scan per MD order four hours after in and out cath.  Bladder scan showed zero.  Will continue to monitor the patient.

## 2016-09-02 NOTE — Progress Notes (Signed)
Complaining of elbow hurting. Has already had fentanyl that was ordered.

## 2016-09-02 NOTE — Progress Notes (Signed)
[facility] in [facility]. Bladder scanned pt prior to transport and had 94 mL present. Pt to be transported via [facility] and nurse tech soon.

## 2016-09-03 MED ORDER — LORAZEPAM 1 MG PO TABS
1.0000 mg | ORAL_TABLET | ORAL | Status: DC | PRN
Start: 2016-09-03 — End: 2016-09-04

## 2016-09-03 NOTE — Progress Notes (Signed)
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 The patient has had ED visits resulted from complications of falls on 05/11/2016, 07/26/2016, and 08/15/2016. The patient was taken off of warfarin after her last ED visit on 08/15/2016. She was started on aspirin. According to the patient's daughter, the patient has had increasing instability with her gait and lower extremity weakness resulting in more difficulty with ambulation in the past. 4 days. In addition, the patient has had poor oral intake. There is no reports of vomiting, diarrhea, chest pain, respiratory distress. Initial evaluation in the emergency department including CT of the cervical spine, chest, and abdomen did not reveal any traumatic injuries. CT of the abdomen showed a large left flank hematoma measuring 10.1 x 5.6 x 13 cm. during the hospitalization, the patient's hemoglobin continued to trend downward secondary to her large hematoma. Although her blood pressures remained soft, she remained hemodynamically stable. The patient developed agitation for which she required Haldol. The patient developed RVR due to her atrial fibrillation and was transferred to the stepdown unit and placed on diltiazem drip. Palliative medicine was consulted. After discussion with the patient's family, it was agreed upon by all parties involved changed patient's focus of care to that of full comfort. The patient's family was agreeable to transition to residential hospice.  Assessment/Plan: Acute blood loss anemia -Secondary to large  left flank hematoma -Hemoglobin 13.9>>>10.3>>9.9 -Check coags--INR 1.36 -Trend hemoglobin -Remains hemodynamically stable -after discussion with patient's family, the patient's focus of care has been changed to focus on full comfort  Persistent Atrial fibrillation with RVR -developed RVR am 09/02/16--in part due to ABLA and also inability to take po meds -started diltiazem drip--HR improved, now weaned off -transferred to step down -09/02/16--personally reviewed EKG--Afib RVR with rate related ST depression -CHADS-VASc = 4--poor candidate for [facility] due to frequent falls with bleeding complications -after discussion with patient's family, the patient's focus of care has been changed to focus on full comfort -as a means of comfort-->lopressor 2.5 mg IV q 6 prn HR >110  Dehydration -increasing serum creatinine -due to decrease po intake -started IVF  Acute toxicencephalopathy -The patient is very somnolent on examination 09/01/2016 -Likely secondary to a combination of opioids in the setting of renal dysfunction--patient was given 2 x morphine 4mg  and hydrocodone -d/c IV opioids, hydrocodone -tramadol for severe pain for now--re-evaluate when pt is more alert -UA neg for pyuria -CT brain--neg for acute findings -Discontinue alprazolam -no longer active issue as patient's focus of care is now changed to focus on full comfort  Large left flank hematoma -Secondary to mechanical fall -Holding ASA -CT cervical spine negative for fracture or dislocation -morphine prn severe pain  Dementia with behavioral disturbance -Continue Zoloft, Zyprexa when patient able to take posafely -haldol prn agitation  Coronary artery disease -Continue Imdur -holding ASA due to ABLA  Tachybradycardia syndrome -Status post permanent pacemaker -follow Dr. Hillis Range  Renal failure of unknown chronicity -monitor BMP  Goals of Care -DNR -appreciate palliative medicine -after discussion with  patient's family, the patient's focus of care has been changed to focus  on full comfort   Disposition Plan:   Residential hospice when bed available  Family Communication:   Daughters updated at bedside 6/9  Consultants:  Palliative medicine  Code Status:  DNR  DVT Prophylaxis:  SCD   Procedures: As Listed in Progress Note Above  Antibiotics: None    Subjective: No reports of vomiting, respiratory distress, diarrhea, uncontrolled pain.  Objective: Vitals:   09/03/16 0200 09/03/16 0400 09/03/16 0727 09/03/16 1131  BP:      Pulse:      Resp: (!) 9  12 11   Temp:  97.8 F (36.6 C) 97.3 F (36.3 C) 97.5 F (36.4 C)  TempSrc:  Oral Axillary Axillary  SpO2:      Weight:  48.7 kg (107 lb 5.8 oz)      Intake/Output Summary (Last 24 hours) at 09/03/16 1502 Last data filed at 09/03/16 0513  Gross per 24 hour  Intake               15 ml  Output              300 ml  Net             -285 ml   Weight change:  Exam:   General:  Pt is alert, follows commands appropriately, not in acute distress  HEENT: No icterus, No thrush, No neck mass, [place]/AT  Cardiovascular: IRRR, S1/S2, no rubs, no gallops  Respiratory: Bibasilar rales. No wheezing. Good air movement  Abdomen: Soft/+BS, non tender, non distended, no guarding;  large left flank hematoma extending from the patient's inferior ribs to the pelvic iliac crest on the left  Extremities: No edema, No lymphangitis, No petechiae, No rashes, no synovitis   Data Reviewed: I have personally reviewed following labs and imaging studies Basic Metabolic Panel:  Recent Labs Lab 08/31/16 2112 09/02/16 1043  NA 139 139  K 4.1 4.7  CL 102 104  CO2 25 25  GLUCOSE 102* 170*  BUN 30* 26*  CREATININE 1.50* 1.67*  CALCIUM 9.3 8.8*   Liver Function Tests:  Recent Labs Lab 08/31/16 2112  AST 43*  ALT 36  ALKPHOS 136*  BILITOT 1.6*  PROT 7.7  ALBUMIN 4.2   No results for input(s): LIPASE, AMYLASE in the last  168 hours. No results for input(s): AMMONIA in the last 168 hours. Coagulation Profile:  Recent Labs Lab 08/31/16 2112  INR 1.36   CBC:  Recent Labs Lab 08/31/16 2112 09/01/16 0626 09/02/16 1043  WBC 11.5* 9.3 11.0*  NEUTROABS 7.6  --   --   HGB 13.9 10.3* 9.9*  HCT 45.3 33.8* 32.9*  MCV 85.8 86.2 87.0  PLT 403* 308 365   Cardiac Enzymes: No results for input(s): CKTOTAL, CKMB, CKMBINDEX, TROPONINI in the last 168 hours. BNP: Invalid input(s): POCBNP CBG: No results for input(s): GLUCAP in the last 168 hours. HbA1C: No results for input(s): HGBA1C in the last 72 hours. Urine analysis:    Component Value Date/Time   COLORURINE YELLOW 08/31/2016 2246   APPEARANCEUR CLEAR 08/31/2016 2246   LABSPEC 1.020 08/31/2016 2246   PHURINE 5.0 08/31/2016 2246   GLUCOSEU NEGATIVE 08/31/2016 2246   HGBUR NEGATIVE 08/31/2016 2246   BILIRUBINUR NEGATIVE 08/31/2016 2246   KETONESUR NEGATIVE 08/31/2016 2246   PROTEINUR 100 (A) 08/31/2016 2246   UROBILINOGEN 1.0 09/19/2009 1504   NITRITE NEGATIVE 08/31/2016 2246   LEUKOCYTESUR NEGATIVE 08/31/2016 2246   Sepsis Labs: @LABRCNTIP (procalcitonin:4,lacticidven:4) ) Recent Results (from the past 240 hour(s))  MRSA  PCR Screening     Status: None   Collection Time: 09/01/16  1:55 AM  Result Value Ref Range Status   MRSA by PCR NEGATIVE NEGATIVE Final    Comment:        The GeneXpert MRSA Assay (FDA approved for NASAL specimens only), is one component of a comprehensive MRSA colonization surveillance program. It is not intended to diagnose MRSA infection nor to guide or monitor treatment for MRSA infections.   MRSA PCR Screening     Status: None   Collection Time: 09/02/16  1:32 PM  Result Value Ref Range Status   MRSA by PCR NEGATIVE NEGATIVE Final    Comment:        The GeneXpert MRSA Assay (FDA approved for NASAL specimens only), is one component of a comprehensive MRSA colonization surveillance program. It is  not intended to diagnose MRSA infection nor to guide or monitor treatment for MRSA infections.      Scheduled Meds: . levothyroxine  37.5 mcg Intravenous Daily  . OLANZapine  2.5 mg Oral QHS  . sodium chloride flush  3 mL Intravenous Q12H   Continuous Infusions: . sodium chloride    . diltiazem (CARDIZEM) infusion Stopped (09/02/16 2100)    Procedures/Studies: Ct Abdomen Pelvis Wo Contrast  Result Date: 08/31/2016 CLINICAL DATA:  Fall with large swelling to the left flank EXAM: CT CHEST, ABDOMEN AND PELVIS WITHOUT CONTRAST TECHNIQUE: Multidetector CT imaging of the chest, abdomen and pelvis was performed following the standard protocol without IV contrast. COMPARISON:  Radiograph 08/15/2016, CT 08/15/2016 FINDINGS: CT CHEST FINDINGS Cardiovascular: Limited evaluation without the presence of intravenous contrast. Moderate atherosclerotic calcifications of the aorta. Post CABG changes. Coronary artery calcifications. Mild cardiomegaly. Partially visualized intracardiac pacing leads. No large pericardial effusion. Mediastinum/Nodes: Midline trachea. No thyroid mass. No gross mediastinal hematoma. No significantly enlarged mediastinal lymph nodes. Limited assessment for hilar nodes. Esophagus within normal limits Lungs/Pleura: No pneumothorax. Mild apical scarring. No focal consolidation. Trace right pleural effusion. Musculoskeletal: No acute displaced rib fracture. Mild superior endplate compression fracture of T11, uncertain acuity. CT ABDOMEN PELVIS FINDINGS Hepatobiliary: No focal hepatic abnormality. High density sludge or multiple small stones in the gallbladder. No biliary dilatation Pancreas: Unremarkable. No pancreatic ductal dilatation or surrounding inflammatory changes. Spleen: Granuloma.  Otherwise negative. Adrenals/Urinary Tract: Adrenal glands are within normal limits. No hydronephrosis. The bladder is unremarkable. Stomach/Bowel: The stomach is nonenlarged. No dilated small bowel.  No colon wall thickening. Sigmoid diverticular disease without acute inflammation. Vascular/Lymphatic: Aortic atherosclerosis. No enlarged abdominal or pelvic lymph nodes. Reproductive: Status post hysterectomy. No adnexal masses. Other: No free air or free fluid. Musculoskeletal: Lumbar spine demonstrates trace retrolisthesis of L5 on S1. Grade 1 anterolisthesis of L3 on L4 and L4 on L5. No acute osseous abnormality. Large hyperdense mass in the left flank area, this measures 10.1 cm AP x 5.6 cm transverse 13 cm craniocaudad. Hematocrit level is present. Edema surrounding the hyperdense mass. This would be consistent with a soft tissue hematoma. IMPRESSION: 1. Large 13 x 10.1 x 5.6 cm hyperdense mass within the left flank soft tissues extending from the level of the inferior ribs to the left iliac crest ; this would be consistent with a large soft tissue hematoma. No underlying left rib fracture. 2. Negative for acute thoracic injury allowing for absence of contrast. 3. No definite CT evidence for acute solid organ injury allowing for absence of contrast, no free air or free fluid 4. Age indeterminate superior endplate compression deformity of T11.  5. Hyperdense sludge or gallstones in the gallbladder Electronically Signed   By: Jasmine Pang M.D.   On: 08/31/2016 22:43   Dg Chest 1 View  Result Date: 08/15/2016 CLINICAL DATA:  Collapsed in hallway at nursing facility. LEFT hip pain. Fissure dementia. EXAM: CHEST 1 VIEW COMPARISON:  Chest radiograph September 30, 2015 FINDINGS: The cardiac silhouette is mildly enlarged. Status post median sternotomy for CABG. Dual lead LEFT cardiac pacemaker in situ. Mild calcific atherosclerosis of the aortic arch. Diffuse interstitial prominence without pleural effusion or focal consolidation. No pneumothorax. Osteopenia. IMPRESSION: Mild interstitial prominence, increased from prior examination can be seen with pulmonary edema, atypical infection. Mild cardiomegaly. Electronically  Signed   By: Awilda Metro M.D.   On: 08/15/2016 14:08   Ct Head Wo Contrast  Result Date: 08/31/2016 CLINICAL DATA:  Patient fell backward against the computer chest. Pain. EXAM: CT HEAD WITHOUT CONTRAST CT CERVICAL SPINE WITHOUT CONTRAST TECHNIQUE: Multidetector CT imaging of the head and cervical spine was performed following the standard protocol without intravenous contrast. Multiplanar CT image reconstructions of the cervical spine were also generated. COMPARISON:  07/26/2016 FINDINGS: CT HEAD FINDINGS Brain: Chronic stable superficial and moderate central atrophy with moderate degree of small vessel ischemia. Small bilateral basal ganglial lacunar infarcts. No acute intracranial hemorrhage, midline shift or edema. No extra-axial fluid collections. No intra-axial mass lesions. No acute large vascular territory infarct. The fourth ventricle and basal cisterns are midline without effacement. Vascular: No hyperdense vessels. Atherosclerosis of the carotid siphons and right vertebral artery. Skull: No acute skull fracture nor primary osseous lesions. Sinuses/Orbits: Minimal mucosal thickening of the anterior ethmoid sinus. No acute sinusitis. Clear mastoids. Bilateral lens replacements. Intact orbits and globes. Other: Suggests a mild periorbital soft tissue swelling on the right. CT CERVICAL SPINE FINDINGS Alignment: Chronic stable appearing anterolisthesis of C3 on C4 by 2 mm, C4 on C5 by 4 mm and 2 mm of C5 on C6. Bilateral facet arthropathy without jumped appearing facets. Skull base and vertebrae: The craniocervical relationship is maintained. No skullbase fracture. No cervical vertebral body fracture bone destruction. Soft tissues and spinal canal: No prevertebral fluid or swelling. No visible canal hematoma. Disc levels: Chronic stable degenerative disc disease C4-5, C5-6 and C6-7 with hypertrophic facet arthropathy as before. Upper chest: Apical scarring is noted of the lung apices. Extracranial  carotid arteriosclerosis. No evidence of thyroid mass. Other: None IMPRESSION: 1. Cerebral atrophy with chronic small vessel ischemic disease of white matter. No acute intracranial abnormality. 2. Similar appearance of the cervical spine with multilevel degenerative anterolisthesis of C4 through C7. No fracture or bone destruction. Electronically Signed   By: Tollie Eth M.D.   On: 08/31/2016 22:34   Ct Chest Wo Contrast  Result Date: 08/31/2016 CLINICAL DATA:  Fall with large swelling to the left flank EXAM: CT CHEST, ABDOMEN AND PELVIS WITHOUT CONTRAST TECHNIQUE: Multidetector CT imaging of the chest, abdomen and pelvis was performed following the standard protocol without IV contrast. COMPARISON:  Radiograph 08/15/2016, CT 08/15/2016 FINDINGS: CT CHEST FINDINGS Cardiovascular: Limited evaluation without the presence of intravenous contrast. Moderate atherosclerotic calcifications of the aorta. Post CABG changes. Coronary artery calcifications. Mild cardiomegaly. Partially visualized intracardiac pacing leads. No large pericardial effusion. Mediastinum/Nodes: Midline trachea. No thyroid mass. No gross mediastinal hematoma. No significantly enlarged mediastinal lymph nodes. Limited assessment for hilar nodes. Esophagus within normal limits Lungs/Pleura: No pneumothorax. Mild apical scarring. No focal consolidation. Trace right pleural effusion. Musculoskeletal: No acute displaced rib fracture. Mild superior endplate compression  fracture of T11, uncertain acuity. CT ABDOMEN PELVIS FINDINGS Hepatobiliary: No focal hepatic abnormality. High density sludge or multiple small stones in the gallbladder. No biliary dilatation Pancreas: Unremarkable. No pancreatic ductal dilatation or surrounding inflammatory changes. Spleen: Granuloma.  Otherwise negative. Adrenals/Urinary Tract: Adrenal glands are within normal limits. No hydronephrosis. The bladder is unremarkable. Stomach/Bowel: The stomach is nonenlarged. No dilated  small bowel. No colon wall thickening. Sigmoid diverticular disease without acute inflammation. Vascular/Lymphatic: Aortic atherosclerosis. No enlarged abdominal or pelvic lymph nodes. Reproductive: Status post hysterectomy. No adnexal masses. Other: No free air or free fluid. Musculoskeletal: Lumbar spine demonstrates trace retrolisthesis of L5 on S1. Grade 1 anterolisthesis of L3 on L4 and L4 on L5. No acute osseous abnormality. Large hyperdense mass in the left flank area, this measures 10.1 cm AP x 5.6 cm transverse 13 cm craniocaudad. Hematocrit level is present. Edema surrounding the hyperdense mass. This would be consistent with a soft tissue hematoma. IMPRESSION: 1. Large 13 x 10.1 x 5.6 cm hyperdense mass within the left flank soft tissues extending from the level of the inferior ribs to the left iliac crest ; this would be consistent with a large soft tissue hematoma. No underlying left rib fracture. 2. Negative for acute thoracic injury allowing for absence of contrast. 3. No definite CT evidence for acute solid organ injury allowing for absence of contrast, no free air or free fluid 4. Age indeterminate superior endplate compression deformity of T11. 5. Hyperdense sludge or gallstones in the gallbladder Electronically Signed   By: Jasmine PangKim  Fujinaga M.D.   On: 08/31/2016 22:43   Ct Cervical Spine Wo Contrast  Result Date: 08/31/2016 CLINICAL DATA:  Patient fell backward against the computer chest. Pain. EXAM: CT HEAD WITHOUT CONTRAST CT CERVICAL SPINE WITHOUT CONTRAST TECHNIQUE: Multidetector CT imaging of the head and cervical spine was performed following the standard protocol without intravenous contrast. Multiplanar CT image reconstructions of the cervical spine were also generated. COMPARISON:  07/26/2016 FINDINGS: CT HEAD FINDINGS Brain: Chronic stable superficial and moderate central atrophy with moderate degree of small vessel ischemia. Small bilateral basal ganglial lacunar infarcts. No acute  intracranial hemorrhage, midline shift or edema. No extra-axial fluid collections. No intra-axial mass lesions. No acute large vascular territory infarct. The fourth ventricle and basal cisterns are midline without effacement. Vascular: No hyperdense vessels. Atherosclerosis of the carotid siphons and right vertebral artery. Skull: No acute skull fracture nor primary osseous lesions. Sinuses/Orbits: Minimal mucosal thickening of the anterior ethmoid sinus. No acute sinusitis. Clear mastoids. Bilateral lens replacements. Intact orbits and globes. Other: Suggests a mild periorbital soft tissue swelling on the right. CT CERVICAL SPINE FINDINGS Alignment: Chronic stable appearing anterolisthesis of C3 on C4 by 2 mm, C4 on C5 by 4 mm and 2 mm of C5 on C6. Bilateral facet arthropathy without jumped appearing facets. Skull base and vertebrae: The craniocervical relationship is maintained. No skullbase fracture. No cervical vertebral body fracture bone destruction. Soft tissues and spinal canal: No prevertebral fluid or swelling. No visible canal hematoma. Disc levels: Chronic stable degenerative disc disease C4-5, C5-6 and C6-7 with hypertrophic facet arthropathy as before. Upper chest: Apical scarring is noted of the lung apices. Extracranial carotid arteriosclerosis. No evidence of thyroid mass. Other: None IMPRESSION: 1. Cerebral atrophy with chronic small vessel ischemic disease of white matter. No acute intracranial abnormality. 2. Similar appearance of the cervical spine with multilevel degenerative anterolisthesis of C4 through C7. No fracture or bone destruction. Electronically Signed   By: Tollie Ethavid  Kwon  M.D.   On: 08/31/2016 22:34   Ct Hip Left Wo Contrast  Result Date: 08/15/2016 CLINICAL DATA:  Left hip pain secondary to a fall this morning. EXAM: CT OF THE LEFT HIP WITHOUT CONTRAST TECHNIQUE: Multidetector CT imaging of the left hip was performed according to the standard protocol. Multiplanar CT image  reconstructions were also generated. COMPARISON:  Radiographs dated 08/15/2016 FINDINGS: Bones/Joint/Cartilage There is no fracture. There is joint space narrowing medially and posteriorly in the left hip. 1 cm complex subcortical cyst in the anterior superior aspect of the left acetabulum. Soft tissues Calcific tendinopathy of the hamstring tendons originating from the left ischial tuberosity. No joint effusion or appreciable soft tissue contusion. Degenerative disc disease at L5-S1, incompletely visualized. Extensive diverticulosis of the sigmoid portion of the colon. Nonspecific free fluid in the pelvis of unknown etiology. IMPRESSION: No acute abnormality of the left hip. Osteoarthritic changes of the left hip. Ascites in the pelvis of unknown etiology. Sigmoid diverticulosis. Electronically Signed   By: Francene Boyers M.D.   On: 08/15/2016 15:46   Dg Hand Complete Left  Result Date: 08/15/2016 CLINICAL DATA:  Fall with bruising to the hand EXAM: LEFT HAND - COMPLETE 3+ VIEW COMPARISON:  None. FINDINGS: No subluxation or radiopaque foreign body. Calcification at the triangular fibrocartilage. Questionable nondisplaced fracture at the dorsal base of the middle phalanx on the lateral view, exact digit uncertain, possibly third digit. IMPRESSION: 1. Questionable nondisplaced fracture involving dorsal base of the middle phalanx on lateral view, possibly third digit. 2. Otherwise no acute osseous abnormality is seen. 3. Triangular fibrocartilage calcifications suggesting chondrocalcinosis Electronically Signed   By: Jasmine Pang M.D.   On: 08/15/2016 14:11   Dg Hand Complete Right  Result Date: 08/15/2016 CLINICAL DATA:  Collapsed pain with bruising to both hands EXAM: RIGHT HAND - COMPLETE 3+ VIEW COMPARISON:  None. FINDINGS: No definite acute displaced fracture or malalignment. Large amount of soft tissue swelling over the dorsum of the hand. No radiopaque foreign body. Calcification at the triangular  fibrocartilage. Additional soft tissue calcification adjacent to the scaphoid. IMPRESSION: 1. Large amount of dorsal soft tissue swelling. No definite acute displaced fracture is seen. 2. Calcification at the triangular fibrocartilage suggesting chondrocalcinosis. Electronically Signed   By: Jasmine Pang M.D.   On: 08/15/2016 14:09   Dg Hip Unilat With Pelvis 2-3 Views Left  Result Date: 08/15/2016 CLINICAL DATA:  Fall with left hip pain EXAM: DG HIP (WITH OR WITHOUT PELVIS) 2-3V LEFT COMPARISON:  05/11/2016 FINDINGS: No acute displaced fracture or malalignment. The SI joints are symmetric. Pubic symphysis appears intact. Small clip inferior to the right ischium. Joint space is relatively maintained. IMPRESSION: No definite acute osseous abnormality. Electronically Signed   By: Jasmine Pang M.D.   On: 08/15/2016 14:13    Edgard Debord, DO  [facility] Pager [phone]  If 7PM-7AM, please contact night-coverage [email or web address] Password [email or web address] 09/03/2016, 3:02 PM   LOS: 1 day

## 2016-09-03 NOTE — Progress Notes (Signed)
Working with weekend care management with scheduling a room at [facility].

## 2016-09-03 NOTE — Clinical Social Work Note (Signed)
CSW faxed [phone]) H&P, palliative note, and [facility]. Per Amy Stone, no beds available at this time, but pt is on waiting list. CSW continuing to follow.   Corlis HoveJeneya Jimie Kuwahara, LCSWA, LCASA Clinical Social Work Belenda Cruise(Wknd coverage) [phone]

## 2016-09-04 MED ORDER — LORAZEPAM 2 MG/ML PO CONC: 1.0000 mg | ORAL | 0 refills | Status: AC | PRN

## 2016-09-04 MED ORDER — MORPHINE SULFATE (CONCENTRATE) 10 MG/0.5ML PO SOLN: 2.5000 mg | ORAL | 0 refills | Status: AC | PRN

## 2016-09-04 NOTE — Discharge Summary (Signed)
Physician Discharge Summary  Latoya Crane [facility] ZOX:[id number] DOB: April 07, 1924 DOA: 08/31/2016  PCP: Sunday Spillers, NP  Admit date: 08/31/2016 Discharge date: 09/04/2016  Admitted From: [facility] Disposition:  [place] [facility]    Discharge Condition: Stable CODE STATUS: FULL COMFORT Diet recommendation: Regular; comfort feeding   Brief/Interim Summary: 83 year old female with a history of persistent atrial fibrillation, coronary artery disease, sick sinus syndrome status post permanent pacemaker, TIA, hypertension, hyperlipidemia, dementia presenting after a 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 The patient has had increasing leg frequent falls in the past 2-3 months. The patient has had ED visits resulted from complications of falls on 05/11/2016, 07/26/2016, and 08/15/2016. The patient was taken off of warfarin after her last ED visit on 08/15/2016. She was started on aspirin. According to the patient's daughter, the patient has had increasing instability with her gait and lower extremity weakness resulting in more difficulty with ambulation in the past. 4 days. In addition, the patient has had poor oral intake. There is no reports of vomiting, diarrhea, chest pain, respiratory distress. Initial evaluation in the emergency department including CT of the cervical spine, chest, and abdomen did not reveal any traumatic injuries. CT of the abdomen showed a  large left flank hematoma measuring 10.1 x 5.6 x 13 cm. Hgb was monitored and showed gradual drop.  The patient was also noted to have AKI likely due to volume depletion.  She was treated with opioids for her flank pain.  There was a fine balance between pain and oversedation.  She developed RVR regarding her Afibv and was transferred to stepdown unit.  She was started on IV diltiazem drip with improvement.  The patient frequently became agitated when effects of opioids waned.  Palliative medicine was  consulted.  After extensive discussion with family, all parties involved agreed that it was in the patient's best interest to transition the patient's focus of care to that of full comfort.  All medications with curative intent and non-comfort focused medications were discontinued.  Family agreed to transfer patient to residential hospice.  Discharge Diagnoses:  Acute blood loss anemia -Secondary to large left flank hematoma -Hemoglobin 13.9>>>10.3>>9.9 -Check coags--INR 1.36 -Trend hemoglobin -Remains hemodynamically stable -after discussion with patient's family, the patient's focus of care has been changed to focus on full comfort  Persistent Atrial fibrillation with RVR -developed RVR am 09/02/16--in part due to ABLA and also inability to take po meds -started diltiazem drip--HR improved, now weaned off -transferred to step down -09/02/16--personally reviewed EKG--Afib RVR with rate related ST depression -CHADS-VASc = 4--poor candidate for [facility] due to frequent falls with bleeding complications -after discussion with patient's family, the patient's focus of care has been changed to focus on full comfort -as a means of comfort-->lopressor 2.5 mg IV q 6 prn HR >110  Dehydration -increasing serum creatinine -due to decrease po intake -started IVF  Acute toxicencephalopathy -The patient is very somnolent on examination 09/01/2016 -Likely secondary to a combination of opioids in the setting of renal dysfunction--patient was given 2 x morphine 4mg  and hydrocodone -d/c IV opioids, hydrocodone -tramadol for severe pain for now--re-evaluate when pt is more alert -UA neg for pyuria -CT brain--neg for acute findings -Discontinue alprazolam -no longer active issue as patient's focus of care is now changed to focus on full comfort  Large left flank hematoma -Secondary to mechanical fall -Holding ASA -CT cervical spine negative for fracture or dislocation -morphine prn severe  pain  Dementia with  behavioral disturbance -Continue Zoloft, Zyprexa when patient able to take posafely -haldol prn agitation  Coronary artery disease -Continue Imdur -holding ASA due to ABLA  Tachybradycardia syndrome -Status post permanent pacemaker -follow Dr. Hillis Range  AKI -monitor BMP -previous baseline creatinine ~0.7-1.0  Goals of Care -DNR -appreciate palliative medicine -after discussion with patient's family, the patient's focus of care has been changed to focus on full comfort   Discharge Instructions  Discharge Instructions    Diet - low sodium heart healthy    Complete by:  As directed    Increase activity slowly    Complete by:  As directed      Allergies as of 09/04/2016   No Known Allergies     Medication List    STOP taking these medications   ALPRAZolam 0.25 MG tablet Commonly known as:  XANAX   aspirin EC 81 MG tablet   CARDIZEM CD 120 MG 24 hr capsule Generic drug:  diltiazem   furosemide 40 MG tablet Commonly known as:  LASIX   HYDROcodone-acetaminophen 5-325 MG tablet Commonly known as:  NORCO/VICODIN   isosorbide mononitrate 10 MG tablet Commonly known as:  ISMO,MONOKET   levothyroxine 75 MCG tablet Commonly known as:  SYNTHROID, LEVOTHROID   metoprolol tartrate 25 MG tablet Commonly known as:  LOPRESSOR   OLANZapine 2.5 MG tablet Commonly known as:  ZYPREXA   potassium chloride SA 20 MEQ tablet Commonly known as:  K-DUR,KLOR-CON   PRESERVISION AREDS 2 PO   ranitidine 150 MG capsule Commonly known as:  ZANTAC   senna-docusate 8.6-50 MG tablet Commonly known as:  Senokot-S   sertraline 25 MG tablet Commonly known as:  ZOLOFT     TAKE these medications   LORazepam 2 MG/ML concentrated solution Commonly known as:  ATIVAN Take 0.5 mLs (1 mg total) by mouth every 4 (four) hours as needed for anxiety.   morphine CONCENTRATE 10 MG/0.5ML Soln concentrated solution Take 0.13 mLs (2.6 mg total) by mouth every  2 (two) hours as needed for moderate pain (breathlessness).       No Known Allergies  Consultations:  Palliative medicine   Procedures/Studies: Ct Abdomen Pelvis Wo Contrast  Result Date: 08/31/2016 CLINICAL DATA:  Fall with large swelling to the left flank EXAM: CT CHEST, ABDOMEN AND PELVIS WITHOUT CONTRAST TECHNIQUE: Multidetector CT imaging of the chest, abdomen and pelvis was performed following the standard protocol without IV contrast. COMPARISON:  Radiograph 08/15/2016, CT 08/15/2016 FINDINGS: CT CHEST FINDINGS Cardiovascular: Limited evaluation without the presence of intravenous contrast. Moderate atherosclerotic calcifications of the aorta. Post CABG changes. Coronary artery calcifications. Mild cardiomegaly. Partially visualized intracardiac pacing leads. No large pericardial effusion. Mediastinum/Nodes: Midline trachea. No thyroid mass. No gross mediastinal hematoma. No significantly enlarged mediastinal lymph nodes. Limited assessment for hilar nodes. Esophagus within normal limits Lungs/Pleura: No pneumothorax. Mild apical scarring. No focal consolidation. Trace right pleural effusion. Musculoskeletal: No acute displaced rib fracture. Mild superior endplate compression fracture of T11, uncertain acuity. CT ABDOMEN PELVIS FINDINGS Hepatobiliary: No focal hepatic abnormality. High density sludge or multiple small stones in the gallbladder. No biliary dilatation Pancreas: Unremarkable. No pancreatic ductal dilatation or surrounding inflammatory changes. Spleen: Granuloma.  Otherwise negative. Adrenals/Urinary Tract: Adrenal glands are within normal limits. No hydronephrosis. The bladder is unremarkable. Stomach/Bowel: The stomach is nonenlarged. No dilated small bowel. No colon wall thickening. Sigmoid diverticular disease without acute inflammation. Vascular/Lymphatic: Aortic atherosclerosis. No enlarged abdominal or pelvic lymph nodes. Reproductive: Status post hysterectomy. No adnexal  masses. Other: No free  air or free fluid. Musculoskeletal: Lumbar spine demonstrates trace retrolisthesis of L5 on S1. Grade 1 anterolisthesis of L3 on L4 and L4 on L5. No acute osseous abnormality. Large hyperdense mass in the left flank area, this measures 10.1 cm AP x 5.6 cm transverse 13 cm craniocaudad. Hematocrit level is present. Edema surrounding the hyperdense mass. This would be consistent with a soft tissue hematoma. IMPRESSION: 1. Large 13 x 10.1 x 5.6 cm hyperdense mass within the left flank soft tissues extending from the level of the inferior ribs to the left iliac crest ; this would be consistent with a large soft tissue hematoma. No underlying left rib fracture. 2. Negative for acute thoracic injury allowing for absence of contrast. 3. No definite CT evidence for acute solid organ injury allowing for absence of contrast, no free air or free fluid 4. Age indeterminate superior endplate compression deformity of T11. 5. Hyperdense sludge or gallstones in the gallbladder Electronically Signed   By: Jasmine Pang M.D.   On: 08/31/2016 22:43   Dg Chest 1 View  Result Date: 08/15/2016 CLINICAL DATA:  Collapsed in hallway at nursing facility. LEFT hip pain. Fissure dementia. EXAM: CHEST 1 VIEW COMPARISON:  Chest radiograph September 30, 2015 FINDINGS: The cardiac silhouette is mildly enlarged. Status post median sternotomy for CABG. Dual lead LEFT cardiac pacemaker in situ. Mild calcific atherosclerosis of the aortic arch. Diffuse interstitial prominence without pleural effusion or focal consolidation. No pneumothorax. Osteopenia. IMPRESSION: Mild interstitial prominence, increased from prior examination can be seen with pulmonary edema, atypical infection. Mild cardiomegaly. Electronically Signed   By: Awilda Metro M.D.   On: 08/15/2016 14:08   Ct Head Wo Contrast  Result Date: 08/31/2016 CLINICAL DATA:  Patient fell backward against the computer chest. Pain. EXAM: CT HEAD WITHOUT CONTRAST CT  CERVICAL SPINE WITHOUT CONTRAST TECHNIQUE: Multidetector CT imaging of the head and cervical spine was performed following the standard protocol without intravenous contrast. Multiplanar CT image reconstructions of the cervical spine were also generated. COMPARISON:  07/26/2016 FINDINGS: CT HEAD FINDINGS Brain: Chronic stable superficial and moderate central atrophy with moderate degree of small vessel ischemia. Small bilateral basal ganglial lacunar infarcts. No acute intracranial hemorrhage, midline shift or edema. No extra-axial fluid collections. No intra-axial mass lesions. No acute large vascular territory infarct. The fourth ventricle and basal cisterns are midline without effacement. Vascular: No hyperdense vessels. Atherosclerosis of the carotid siphons and right vertebral artery. Skull: No acute skull fracture nor primary osseous lesions. Sinuses/Orbits: Minimal mucosal thickening of the anterior ethmoid sinus. No acute sinusitis. Clear mastoids. Bilateral lens replacements. Intact orbits and globes. Other: Suggests a mild periorbital soft tissue swelling on the right. CT CERVICAL SPINE FINDINGS Alignment: Chronic stable appearing anterolisthesis of C3 on C4 by 2 mm, C4 on C5 by 4 mm and 2 mm of C5 on C6. Bilateral facet arthropathy without jumped appearing facets. Skull base and vertebrae: The craniocervical relationship is maintained. No skullbase fracture. No cervical vertebral body fracture bone destruction. Soft tissues and spinal canal: No prevertebral fluid or swelling. No visible canal hematoma. Disc levels: Chronic stable degenerative disc disease C4-5, C5-6 and C6-7 with hypertrophic facet arthropathy as before. Upper chest: Apical scarring is noted of the lung apices. Extracranial carotid arteriosclerosis. No evidence of thyroid mass. Other: None IMPRESSION: 1. Cerebral atrophy with chronic small vessel ischemic disease of white matter. No acute intracranial abnormality. 2. Similar appearance of  the cervical spine with multilevel degenerative anterolisthesis of C4 through C7. No fracture or  bone destruction. Electronically Signed   By: Tollie Eth M.D.   On: 08/31/2016 22:34   Ct Chest Wo Contrast  Result Date: 08/31/2016 CLINICAL DATA:  Fall with large swelling to the left flank EXAM: CT CHEST, ABDOMEN AND PELVIS WITHOUT CONTRAST TECHNIQUE: Multidetector CT imaging of the chest, abdomen and pelvis was performed following the standard protocol without IV contrast. COMPARISON:  Radiograph 08/15/2016, CT 08/15/2016 FINDINGS: CT CHEST FINDINGS Cardiovascular: Limited evaluation without the presence of intravenous contrast. Moderate atherosclerotic calcifications of the aorta. Post CABG changes. Coronary artery calcifications. Mild cardiomegaly. Partially visualized intracardiac pacing leads. No large pericardial effusion. Mediastinum/Nodes: Midline trachea. No thyroid mass. No gross mediastinal hematoma. No significantly enlarged mediastinal lymph nodes. Limited assessment for hilar nodes. Esophagus within normal limits Lungs/Pleura: No pneumothorax. Mild apical scarring. No focal consolidation. Trace right pleural effusion. Musculoskeletal: No acute displaced rib fracture. Mild superior endplate compression fracture of T11, uncertain acuity. CT ABDOMEN PELVIS FINDINGS Hepatobiliary: No focal hepatic abnormality. High density sludge or multiple small stones in the gallbladder. No biliary dilatation Pancreas: Unremarkable. No pancreatic ductal dilatation or surrounding inflammatory changes. Spleen: Granuloma.  Otherwise negative. Adrenals/Urinary Tract: Adrenal glands are within normal limits. No hydronephrosis. The bladder is unremarkable. Stomach/Bowel: The stomach is nonenlarged. No dilated small bowel. No colon wall thickening. Sigmoid diverticular disease without acute inflammation. Vascular/Lymphatic: Aortic atherosclerosis. No enlarged abdominal or pelvic lymph nodes. Reproductive: Status post  hysterectomy. No adnexal masses. Other: No free air or free fluid. Musculoskeletal: Lumbar spine demonstrates trace retrolisthesis of L5 on S1. Grade 1 anterolisthesis of L3 on L4 and L4 on L5. No acute osseous abnormality. Large hyperdense mass in the left flank area, this measures 10.1 cm AP x 5.6 cm transverse 13 cm craniocaudad. Hematocrit level is present. Edema surrounding the hyperdense mass. This would be consistent with a soft tissue hematoma. IMPRESSION: 1. Large 13 x 10.1 x 5.6 cm hyperdense mass within the left flank soft tissues extending from the level of the inferior ribs to the left iliac crest ; this would be consistent with a large soft tissue hematoma. No underlying left rib fracture. 2. Negative for acute thoracic injury allowing for absence of contrast. 3. No definite CT evidence for acute solid organ injury allowing for absence of contrast, no free air or free fluid 4. Age indeterminate superior endplate compression deformity of T11. 5. Hyperdense sludge or gallstones in the gallbladder Electronically Signed   By: Jasmine Pang M.D.   On: 08/31/2016 22:43   Ct Cervical Spine Wo Contrast  Result Date: 08/31/2016 CLINICAL DATA:  Patient fell backward against the computer chest. Pain. EXAM: CT HEAD WITHOUT CONTRAST CT CERVICAL SPINE WITHOUT CONTRAST TECHNIQUE: Multidetector CT imaging of the head and cervical spine was performed following the standard protocol without intravenous contrast. Multiplanar CT image reconstructions of the cervical spine were also generated. COMPARISON:  07/26/2016 FINDINGS: CT HEAD FINDINGS Brain: Chronic stable superficial and moderate central atrophy with moderate degree of small vessel ischemia. Small bilateral basal ganglial lacunar infarcts. No acute intracranial hemorrhage, midline shift or edema. No extra-axial fluid collections. No intra-axial mass lesions. No acute large vascular territory infarct. The fourth ventricle and basal cisterns are midline without  effacement. Vascular: No hyperdense vessels. Atherosclerosis of the carotid siphons and right vertebral artery. Skull: No acute skull fracture nor primary osseous lesions. Sinuses/Orbits: Minimal mucosal thickening of the anterior ethmoid sinus. No acute sinusitis. Clear mastoids. Bilateral lens replacements. Intact orbits and globes. Other: Suggests a mild periorbital soft tissue swelling  on the right. CT CERVICAL SPINE FINDINGS Alignment: Chronic stable appearing anterolisthesis of C3 on C4 by 2 mm, C4 on C5 by 4 mm and 2 mm of C5 on C6. Bilateral facet arthropathy without jumped appearing facets. Skull base and vertebrae: The craniocervical relationship is maintained. No skullbase fracture. No cervical vertebral body fracture bone destruction. Soft tissues and spinal canal: No prevertebral fluid or swelling. No visible canal hematoma. Disc levels: Chronic stable degenerative disc disease C4-5, C5-6 and C6-7 with hypertrophic facet arthropathy as before. Upper chest: Apical scarring is noted of the lung apices. Extracranial carotid arteriosclerosis. No evidence of thyroid mass. Other: None IMPRESSION: 1. Cerebral atrophy with chronic small vessel ischemic disease of white matter. No acute intracranial abnormality. 2. Similar appearance of the cervical spine with multilevel degenerative anterolisthesis of C4 through C7. No fracture or bone destruction. Electronically Signed   By: Tollie Eth M.D.   On: 08/31/2016 22:34   Ct Hip Left Wo Contrast  Result Date: 08/15/2016 CLINICAL DATA:  Left hip pain secondary to a fall this morning. EXAM: CT OF THE LEFT HIP WITHOUT CONTRAST TECHNIQUE: Multidetector CT imaging of the left hip was performed according to the standard protocol. Multiplanar CT image reconstructions were also generated. COMPARISON:  Radiographs dated 08/15/2016 FINDINGS: Bones/Joint/Cartilage There is no fracture. There is joint space narrowing medially and posteriorly in the left hip. 1 cm complex  subcortical cyst in the anterior superior aspect of the left acetabulum. Soft tissues Calcific tendinopathy of the hamstring tendons originating from the left ischial tuberosity. No joint effusion or appreciable soft tissue contusion. Degenerative disc disease at L5-S1, incompletely visualized. Extensive diverticulosis of the sigmoid portion of the colon. Nonspecific free fluid in the pelvis of unknown etiology. IMPRESSION: No acute abnormality of the left hip. Osteoarthritic changes of the left hip. Ascites in the pelvis of unknown etiology. Sigmoid diverticulosis. Electronically Signed   By: Francene Boyers M.D.   On: 08/15/2016 15:46   Dg Hand Complete Left  Result Date: 08/15/2016 CLINICAL DATA:  Fall with bruising to the hand EXAM: LEFT HAND - COMPLETE 3+ VIEW COMPARISON:  None. FINDINGS: No subluxation or radiopaque foreign body. Calcification at the triangular fibrocartilage. Questionable nondisplaced fracture at the dorsal base of the middle phalanx on the lateral view, exact digit uncertain, possibly third digit. IMPRESSION: 1. Questionable nondisplaced fracture involving dorsal base of the middle phalanx on lateral view, possibly third digit. 2. Otherwise no acute osseous abnormality is seen. 3. Triangular fibrocartilage calcifications suggesting chondrocalcinosis Electronically Signed   By: Jasmine Pang M.D.   On: 08/15/2016 14:11   Dg Hand Complete Right  Result Date: 08/15/2016 CLINICAL DATA:  Collapsed pain with bruising to both hands EXAM: RIGHT HAND - COMPLETE 3+ VIEW COMPARISON:  None. FINDINGS: No definite acute displaced fracture or malalignment. Large amount of soft tissue swelling over the dorsum of the hand. No radiopaque foreign body. Calcification at the triangular fibrocartilage. Additional soft tissue calcification adjacent to the scaphoid. IMPRESSION: 1. Large amount of dorsal soft tissue swelling. No definite acute displaced fracture is seen. 2. Calcification at the triangular  fibrocartilage suggesting chondrocalcinosis. Electronically Signed   By: Jasmine Pang M.D.   On: 08/15/2016 14:09   Dg Hip Unilat With Pelvis 2-3 Views Left  Result Date: 08/15/2016 CLINICAL DATA:  Fall with left hip pain EXAM: DG HIP (WITH OR WITHOUT PELVIS) 2-3V LEFT COMPARISON:  05/11/2016 FINDINGS: No acute displaced fracture or malalignment. The SI joints are symmetric. Pubic symphysis appears intact. Small  clip inferior to the right ischium. Joint space is relatively maintained. IMPRESSION: No definite acute osseous abnormality. Electronically Signed   By: Jasmine Pang M.D.   On: 08/15/2016 14:13        Discharge Exam: Vitals:   09/03/16 1619 09/04/16 1200  BP:  128/79  Pulse:  (!) 101  Resp: (!) 9 18  Temp: 97.5 F (36.4 C) 98.6 F (37 C)   Vitals:   09/03/16 0727 09/03/16 1131 09/03/16 1619 09/04/16 1200  BP:    128/79  Pulse:    (!) 101  Resp: 12 11 (!) 9 18  Temp: 97.3 F (36.3 C) 97.5 F (36.4 C) 97.5 F (36.4 C) 98.6 F (37 C)  TempSrc: Axillary Axillary Axillary Axillary  SpO2:    97%  Weight:        General: Pt is alert, awake, not in acute distress Cardiovascular: RRR, S1/S2 +, no rubs, no gallops Respiratory: CTA bilaterally, no wheezing, no rhonchi Abdominal: Soft, NT, ND, bowel sounds + Extremities: no edema, no cyanosis   The results of significant diagnostics from this hospitalization (including imaging, microbiology, ancillary and laboratory) are listed below for reference.    Significant Diagnostic Studies: Ct Abdomen Pelvis Wo Contrast  Result Date: 08/31/2016 CLINICAL DATA:  Fall with large swelling to the left flank EXAM: CT CHEST, ABDOMEN AND PELVIS WITHOUT CONTRAST TECHNIQUE: Multidetector CT imaging of the chest, abdomen and pelvis was performed following the standard protocol without IV contrast. COMPARISON:  Radiograph 08/15/2016, CT 08/15/2016 FINDINGS: CT CHEST FINDINGS Cardiovascular: Limited evaluation without the presence of  intravenous contrast. Moderate atherosclerotic calcifications of the aorta. Post CABG changes. Coronary artery calcifications. Mild cardiomegaly. Partially visualized intracardiac pacing leads. No large pericardial effusion. Mediastinum/Nodes: Midline trachea. No thyroid mass. No gross mediastinal hematoma. No significantly enlarged mediastinal lymph nodes. Limited assessment for hilar nodes. Esophagus within normal limits Lungs/Pleura: No pneumothorax. Mild apical scarring. No focal consolidation. Trace right pleural effusion. Musculoskeletal: No acute displaced rib fracture. Mild superior endplate compression fracture of T11, uncertain acuity. CT ABDOMEN PELVIS FINDINGS Hepatobiliary: No focal hepatic abnormality. High density sludge or multiple small stones in the gallbladder. No biliary dilatation Pancreas: Unremarkable. No pancreatic ductal dilatation or surrounding inflammatory changes. Spleen: Granuloma.  Otherwise negative. Adrenals/Urinary Tract: Adrenal glands are within normal limits. No hydronephrosis. The bladder is unremarkable. Stomach/Bowel: The stomach is nonenlarged. No dilated small bowel. No colon wall thickening. Sigmoid diverticular disease without acute inflammation. Vascular/Lymphatic: Aortic atherosclerosis. No enlarged abdominal or pelvic lymph nodes. Reproductive: Status post hysterectomy. No adnexal masses. Other: No free air or free fluid. Musculoskeletal: Lumbar spine demonstrates trace retrolisthesis of L5 on S1. Grade 1 anterolisthesis of L3 on L4 and L4 on L5. No acute osseous abnormality. Large hyperdense mass in the left flank area, this measures 10.1 cm AP x 5.6 cm transverse 13 cm craniocaudad. Hematocrit level is present. Edema surrounding the hyperdense mass. This would be consistent with a soft tissue hematoma. IMPRESSION: 1. Large 13 x 10.1 x 5.6 cm hyperdense mass within the left flank soft tissues extending from the level of the inferior ribs to the left iliac crest ; this  would be consistent with a large soft tissue hematoma. No underlying left rib fracture. 2. Negative for acute thoracic injury allowing for absence of contrast. 3. No definite CT evidence for acute solid organ injury allowing for absence of contrast, no free air or free fluid 4. Age indeterminate superior endplate compression deformity of T11. 5. Hyperdense sludge or gallstones in  the gallbladder Electronically Signed   By: Jasmine Pang M.D.   On: 08/31/2016 22:43   Dg Chest 1 View  Result Date: 08/15/2016 CLINICAL DATA:  Collapsed in hallway at nursing facility. LEFT hip pain. Fissure dementia. EXAM: CHEST 1 VIEW COMPARISON:  Chest radiograph September 30, 2015 FINDINGS: The cardiac silhouette is mildly enlarged. Status post median sternotomy for CABG. Dual lead LEFT cardiac pacemaker in situ. Mild calcific atherosclerosis of the aortic arch. Diffuse interstitial prominence without pleural effusion or focal consolidation. No pneumothorax. Osteopenia. IMPRESSION: Mild interstitial prominence, increased from prior examination can be seen with pulmonary edema, atypical infection. Mild cardiomegaly. Electronically Signed   By: Awilda Metro M.D.   On: 08/15/2016 14:08   Ct Head Wo Contrast  Result Date: 08/31/2016 CLINICAL DATA:  Patient fell backward against the computer chest. Pain. EXAM: CT HEAD WITHOUT CONTRAST CT CERVICAL SPINE WITHOUT CONTRAST TECHNIQUE: Multidetector CT imaging of the head and cervical spine was performed following the standard protocol without intravenous contrast. Multiplanar CT image reconstructions of the cervical spine were also generated. COMPARISON:  07/26/2016 FINDINGS: CT HEAD FINDINGS Brain: Chronic stable superficial and moderate central atrophy with moderate degree of small vessel ischemia. Small bilateral basal ganglial lacunar infarcts. No acute intracranial hemorrhage, midline shift or edema. No extra-axial fluid collections. No intra-axial mass lesions. No acute large  vascular territory infarct. The fourth ventricle and basal cisterns are midline without effacement. Vascular: No hyperdense vessels. Atherosclerosis of the carotid siphons and right vertebral artery. Skull: No acute skull fracture nor primary osseous lesions. Sinuses/Orbits: Minimal mucosal thickening of the anterior ethmoid sinus. No acute sinusitis. Clear mastoids. Bilateral lens replacements. Intact orbits and globes. Other: Suggests a mild periorbital soft tissue swelling on the right. CT CERVICAL SPINE FINDINGS Alignment: Chronic stable appearing anterolisthesis of C3 on C4 by 2 mm, C4 on C5 by 4 mm and 2 mm of C5 on C6. Bilateral facet arthropathy without jumped appearing facets. Skull base and vertebrae: The craniocervical relationship is maintained. No skullbase fracture. No cervical vertebral body fracture bone destruction. Soft tissues and spinal canal: No prevertebral fluid or swelling. No visible canal hematoma. Disc levels: Chronic stable degenerative disc disease C4-5, C5-6 and C6-7 with hypertrophic facet arthropathy as before. Upper chest: Apical scarring is noted of the lung apices. Extracranial carotid arteriosclerosis. No evidence of thyroid mass. Other: None IMPRESSION: 1. Cerebral atrophy with chronic small vessel ischemic disease of white matter. No acute intracranial abnormality. 2. Similar appearance of the cervical spine with multilevel degenerative anterolisthesis of C4 through C7. No fracture or bone destruction. Electronically Signed   By: Tollie Eth M.D.   On: 08/31/2016 22:34   Ct Chest Wo Contrast  Result Date: 08/31/2016 CLINICAL DATA:  Fall with large swelling to the left flank EXAM: CT CHEST, ABDOMEN AND PELVIS WITHOUT CONTRAST TECHNIQUE: Multidetector CT imaging of the chest, abdomen and pelvis was performed following the standard protocol without IV contrast. COMPARISON:  Radiograph 08/15/2016, CT 08/15/2016 FINDINGS: CT CHEST FINDINGS Cardiovascular: Limited evaluation  without the presence of intravenous contrast. Moderate atherosclerotic calcifications of the aorta. Post CABG changes. Coronary artery calcifications. Mild cardiomegaly. Partially visualized intracardiac pacing leads. No large pericardial effusion. Mediastinum/Nodes: Midline trachea. No thyroid mass. No gross mediastinal hematoma. No significantly enlarged mediastinal lymph nodes. Limited assessment for hilar nodes. Esophagus within normal limits Lungs/Pleura: No pneumothorax. Mild apical scarring. No focal consolidation. Trace right pleural effusion. Musculoskeletal: No acute displaced rib fracture. Mild superior endplate compression fracture of T11, uncertain acuity. CT  ABDOMEN PELVIS FINDINGS Hepatobiliary: No focal hepatic abnormality. High density sludge or multiple small stones in the gallbladder. No biliary dilatation Pancreas: Unremarkable. No pancreatic ductal dilatation or surrounding inflammatory changes. Spleen: Granuloma.  Otherwise negative. Adrenals/Urinary Tract: Adrenal glands are within normal limits. No hydronephrosis. The bladder is unremarkable. Stomach/Bowel: The stomach is nonenlarged. No dilated small bowel. No colon wall thickening. Sigmoid diverticular disease without acute inflammation. Vascular/Lymphatic: Aortic atherosclerosis. No enlarged abdominal or pelvic lymph nodes. Reproductive: Status post hysterectomy. No adnexal masses. Other: No free air or free fluid. Musculoskeletal: Lumbar spine demonstrates trace retrolisthesis of L5 on S1. Grade 1 anterolisthesis of L3 on L4 and L4 on L5. No acute osseous abnormality. Large hyperdense mass in the left flank area, this measures 10.1 cm AP x 5.6 cm transverse 13 cm craniocaudad. Hematocrit level is present. Edema surrounding the hyperdense mass. This would be consistent with a soft tissue hematoma. IMPRESSION: 1. Large 13 x 10.1 x 5.6 cm hyperdense mass within the left flank soft tissues extending from the level of the inferior ribs to the  left iliac crest ; this would be consistent with a large soft tissue hematoma. No underlying left rib fracture. 2. Negative for acute thoracic injury allowing for absence of contrast. 3. No definite CT evidence for acute solid organ injury allowing for absence of contrast, no free air or free fluid 4. Age indeterminate superior endplate compression deformity of T11. 5. Hyperdense sludge or gallstones in the gallbladder Electronically Signed   By: Jasmine Pang M.D.   On: 08/31/2016 22:43   Ct Cervical Spine Wo Contrast  Result Date: 08/31/2016 CLINICAL DATA:  Patient fell backward against the computer chest. Pain. EXAM: CT HEAD WITHOUT CONTRAST CT CERVICAL SPINE WITHOUT CONTRAST TECHNIQUE: Multidetector CT imaging of the head and cervical spine was performed following the standard protocol without intravenous contrast. Multiplanar CT image reconstructions of the cervical spine were also generated. COMPARISON:  07/26/2016 FINDINGS: CT HEAD FINDINGS Brain: Chronic stable superficial and moderate central atrophy with moderate degree of small vessel ischemia. Small bilateral basal ganglial lacunar infarcts. No acute intracranial hemorrhage, midline shift or edema. No extra-axial fluid collections. No intra-axial mass lesions. No acute large vascular territory infarct. The fourth ventricle and basal cisterns are midline without effacement. Vascular: No hyperdense vessels. Atherosclerosis of the carotid siphons and right vertebral artery. Skull: No acute skull fracture nor primary osseous lesions. Sinuses/Orbits: Minimal mucosal thickening of the anterior ethmoid sinus. No acute sinusitis. Clear mastoids. Bilateral lens replacements. Intact orbits and globes. Other: Suggests a mild periorbital soft tissue swelling on the right. CT CERVICAL SPINE FINDINGS Alignment: Chronic stable appearing anterolisthesis of C3 on C4 by 2 mm, C4 on C5 by 4 mm and 2 mm of C5 on C6. Bilateral facet arthropathy without jumped appearing  facets. Skull base and vertebrae: The craniocervical relationship is maintained. No skullbase fracture. No cervical vertebral body fracture bone destruction. Soft tissues and spinal canal: No prevertebral fluid or swelling. No visible canal hematoma. Disc levels: Chronic stable degenerative disc disease C4-5, C5-6 and C6-7 with hypertrophic facet arthropathy as before. Upper chest: Apical scarring is noted of the lung apices. Extracranial carotid arteriosclerosis. No evidence of thyroid mass. Other: None IMPRESSION: 1. Cerebral atrophy with chronic small vessel ischemic disease of white matter. No acute intracranial abnormality. 2. Similar appearance of the cervical spine with multilevel degenerative anterolisthesis of C4 through C7. No fracture or bone destruction. Electronically Signed   By: Tollie Eth M.D.   On: 08/31/2016 22:34  Ct Hip Left Wo Contrast  Result Date: 08/15/2016 CLINICAL DATA:  Left hip pain secondary to a fall this morning. EXAM: CT OF THE LEFT HIP WITHOUT CONTRAST TECHNIQUE: Multidetector CT imaging of the left hip was performed according to the standard protocol. Multiplanar CT image reconstructions were also generated. COMPARISON:  Radiographs dated 08/15/2016 FINDINGS: Bones/Joint/Cartilage There is no fracture. There is joint space narrowing medially and posteriorly in the left hip. 1 cm complex subcortical cyst in the anterior superior aspect of the left acetabulum. Soft tissues Calcific tendinopathy of the hamstring tendons originating from the left ischial tuberosity. No joint effusion or appreciable soft tissue contusion. Degenerative disc disease at L5-S1, incompletely visualized. Extensive diverticulosis of the sigmoid portion of the colon. Nonspecific free fluid in the pelvis of unknown etiology. IMPRESSION: No acute abnormality of the left hip. Osteoarthritic changes of the left hip. Ascites in the pelvis of unknown etiology. Sigmoid diverticulosis. Electronically Signed   By:  Francene Boyers M.D.   On: 08/15/2016 15:46   Dg Hand Complete Left  Result Date: 08/15/2016 CLINICAL DATA:  Fall with bruising to the hand EXAM: LEFT HAND - COMPLETE 3+ VIEW COMPARISON:  None. FINDINGS: No subluxation or radiopaque foreign body. Calcification at the triangular fibrocartilage. Questionable nondisplaced fracture at the dorsal base of the middle phalanx on the lateral view, exact digit uncertain, possibly third digit. IMPRESSION: 1. Questionable nondisplaced fracture involving dorsal base of the middle phalanx on lateral view, possibly third digit. 2. Otherwise no acute osseous abnormality is seen. 3. Triangular fibrocartilage calcifications suggesting chondrocalcinosis Electronically Signed   By: Jasmine Pang M.D.   On: 08/15/2016 14:11   Dg Hand Complete Right  Result Date: 08/15/2016 CLINICAL DATA:  Collapsed pain with bruising to both hands EXAM: RIGHT HAND - COMPLETE 3+ VIEW COMPARISON:  None. FINDINGS: No definite acute displaced fracture or malalignment. Large amount of soft tissue swelling over the dorsum of the hand. No radiopaque foreign body. Calcification at the triangular fibrocartilage. Additional soft tissue calcification adjacent to the scaphoid. IMPRESSION: 1. Large amount of dorsal soft tissue swelling. No definite acute displaced fracture is seen. 2. Calcification at the triangular fibrocartilage suggesting chondrocalcinosis. Electronically Signed   By: Jasmine Pang M.D.   On: 08/15/2016 14:09   Dg Hip Unilat With Pelvis 2-3 Views Left  Result Date: 08/15/2016 CLINICAL DATA:  Fall with left hip pain EXAM: DG HIP (WITH OR WITHOUT PELVIS) 2-3V LEFT COMPARISON:  05/11/2016 FINDINGS: No acute displaced fracture or malalignment. The SI joints are symmetric. Pubic symphysis appears intact. Small clip inferior to the right ischium. Joint space is relatively maintained. IMPRESSION: No definite acute osseous abnormality. Electronically Signed   By: Jasmine Pang M.D.   On:  08/15/2016 14:13     Microbiology: Recent Results (from the past 240 hour(s))  MRSA PCR Screening     Status: None   Collection Time: 09/01/16  1:55 AM  Result Value Ref Range Status   MRSA by PCR NEGATIVE NEGATIVE Final    Comment:        The GeneXpert MRSA Assay (FDA approved for NASAL specimens only), is one component of a comprehensive MRSA colonization surveillance program. It is not intended to diagnose MRSA infection nor to guide or monitor treatment for MRSA infections.   MRSA PCR Screening     Status: None   Collection Time: 09/02/16  1:32 PM  Result Value Ref Range Status   MRSA by PCR NEGATIVE NEGATIVE Final    Comment:  The GeneXpert MRSA Assay (FDA approved for NASAL specimens only), is one component of a comprehensive MRSA colonization surveillance program. It is not intended to diagnose MRSA infection nor to guide or monitor treatment for MRSA infections.      Labs: Basic Metabolic Panel:  Recent Labs Lab 08/31/16 2112 09/02/16 1043  NA 139 139  K 4.1 4.7  CL 102 104  CO2 25 25  GLUCOSE 102* 170*  BUN 30* 26*  CREATININE 1.50* 1.67*  CALCIUM 9.3 8.8*   Liver Function Tests:  Recent Labs Lab 08/31/16 2112  AST 43*  ALT 36  ALKPHOS 136*  BILITOT 1.6*  PROT 7.7  ALBUMIN 4.2   No results for input(s): LIPASE, AMYLASE in the last 168 hours. No results for input(s): AMMONIA in the last 168 hours. CBC:  Recent Labs Lab 08/31/16 2112 09/01/16 0626 09/02/16 1043  WBC 11.5* 9.3 11.0*  NEUTROABS 7.6  --   --   HGB 13.9 10.3* 9.9*  HCT 45.3 33.8* 32.9*  MCV 85.8 86.2 87.0  PLT 403* 308 365   Cardiac Enzymes: No results for input(s): CKTOTAL, CKMB, CKMBINDEX, TROPONINI in the last 168 hours. BNP: Invalid input(s): POCBNP CBG: No results for input(s): GLUCAP in the last 168 hours.  Time coordinating discharge:  Greater than 30 minutes  Signed:  Sharrieff Spratlin, DO [facility] Pager: [phone] 09/04/2016, 12:38  PM

## 2016-09-25 DEATH — deceased

## 2017-11-15 IMAGING — DX DG HAND COMPLETE 3+V*R*
3 series · 3 of 3 positions shown · non-contrast
Comparison: None.

CLINICAL DATA: Collapsed pain with bruising to both hands

EXAM:
RIGHT HAND - COMPLETE 3+ VIEW

[hand pa]
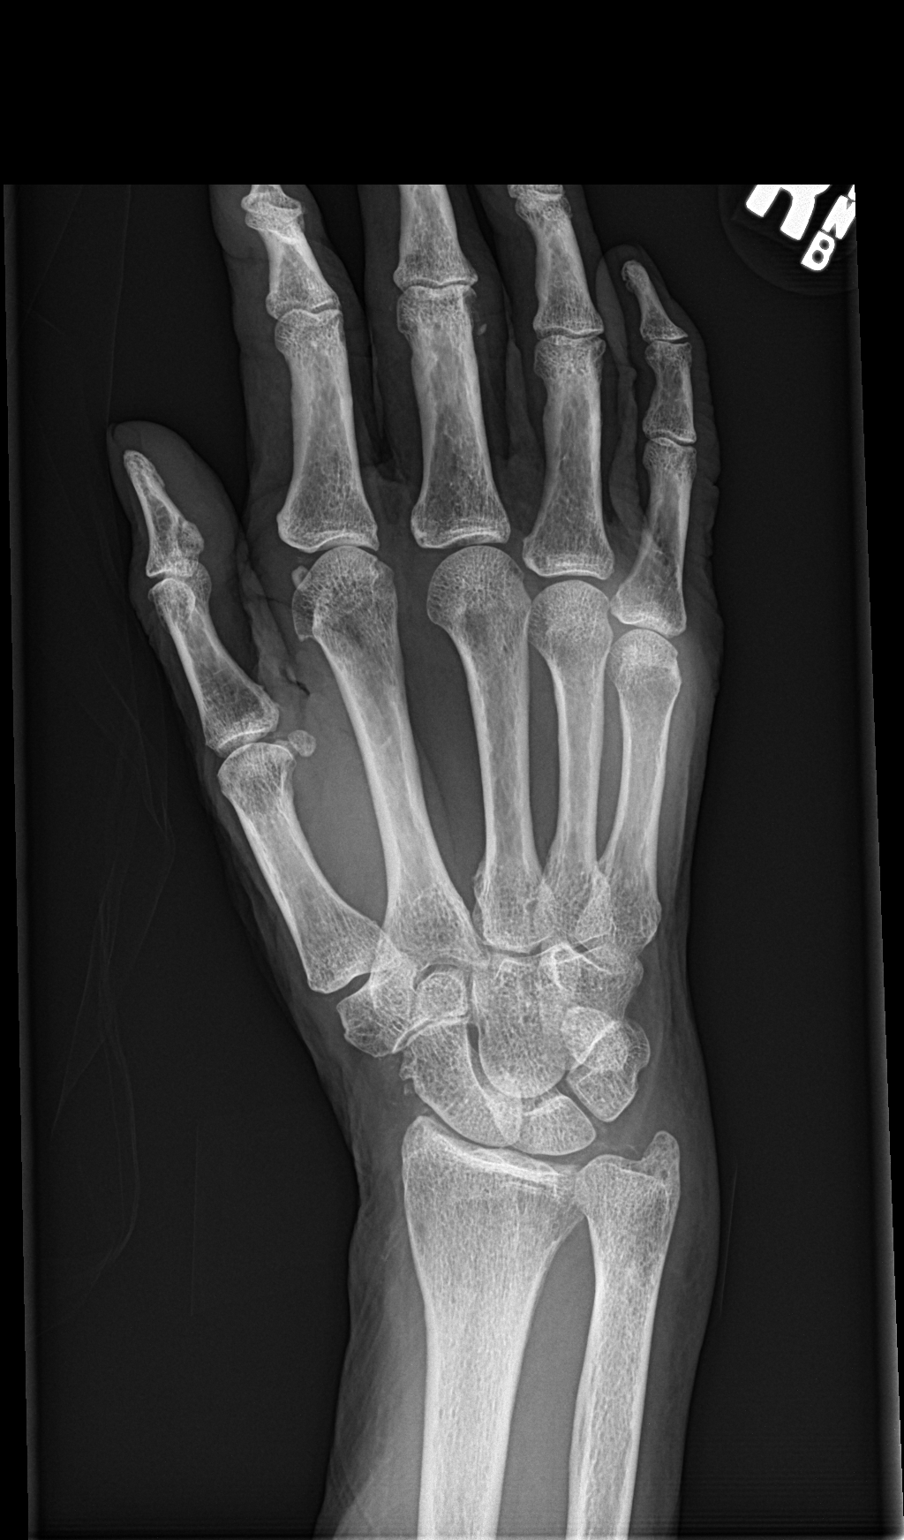

[hand obl]
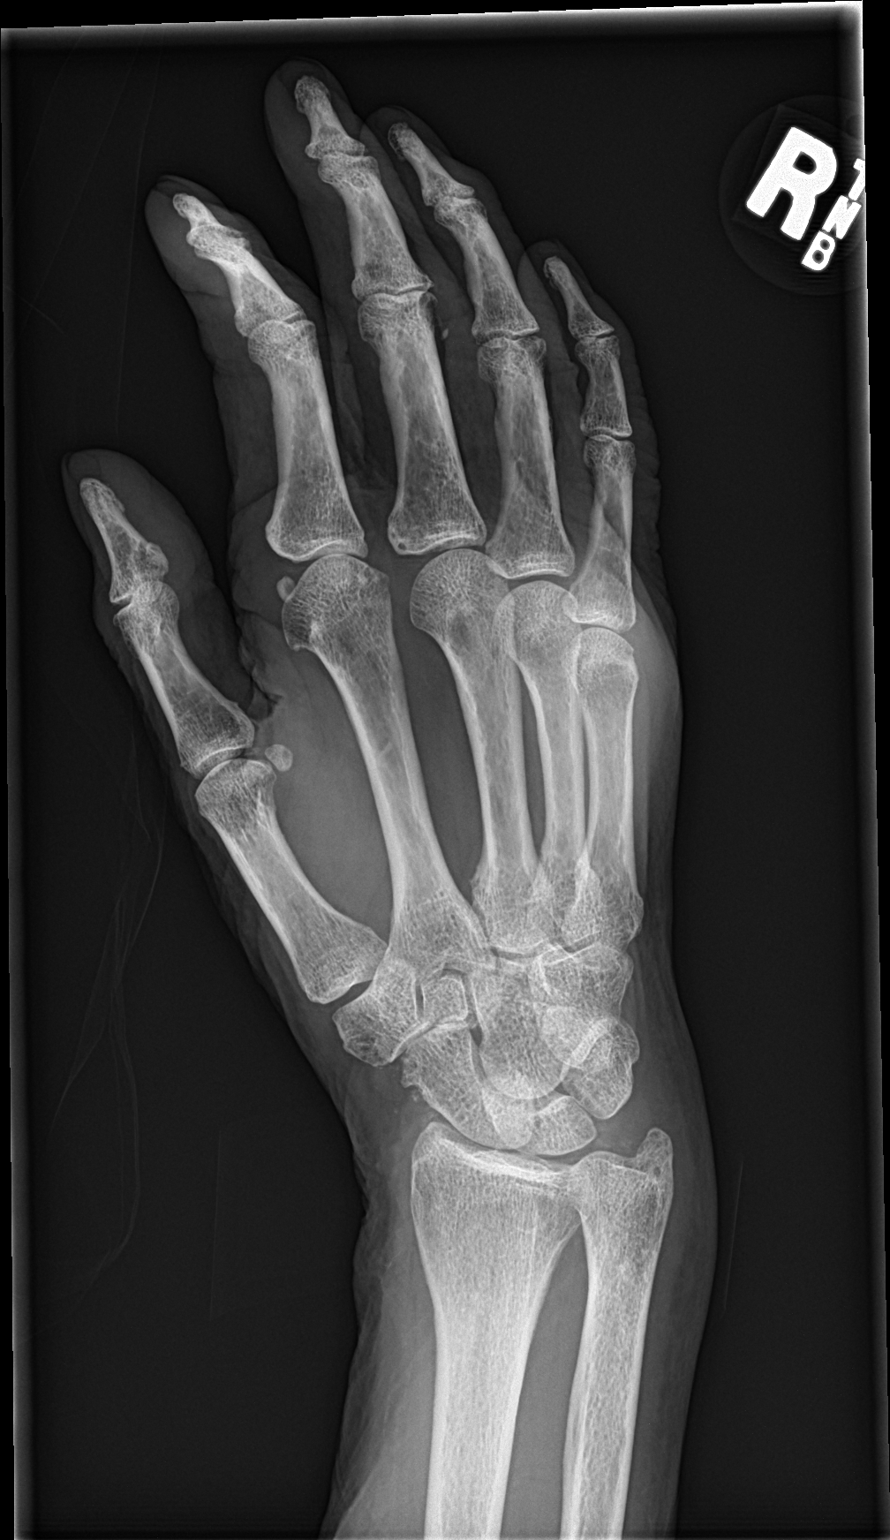

[hand lat]
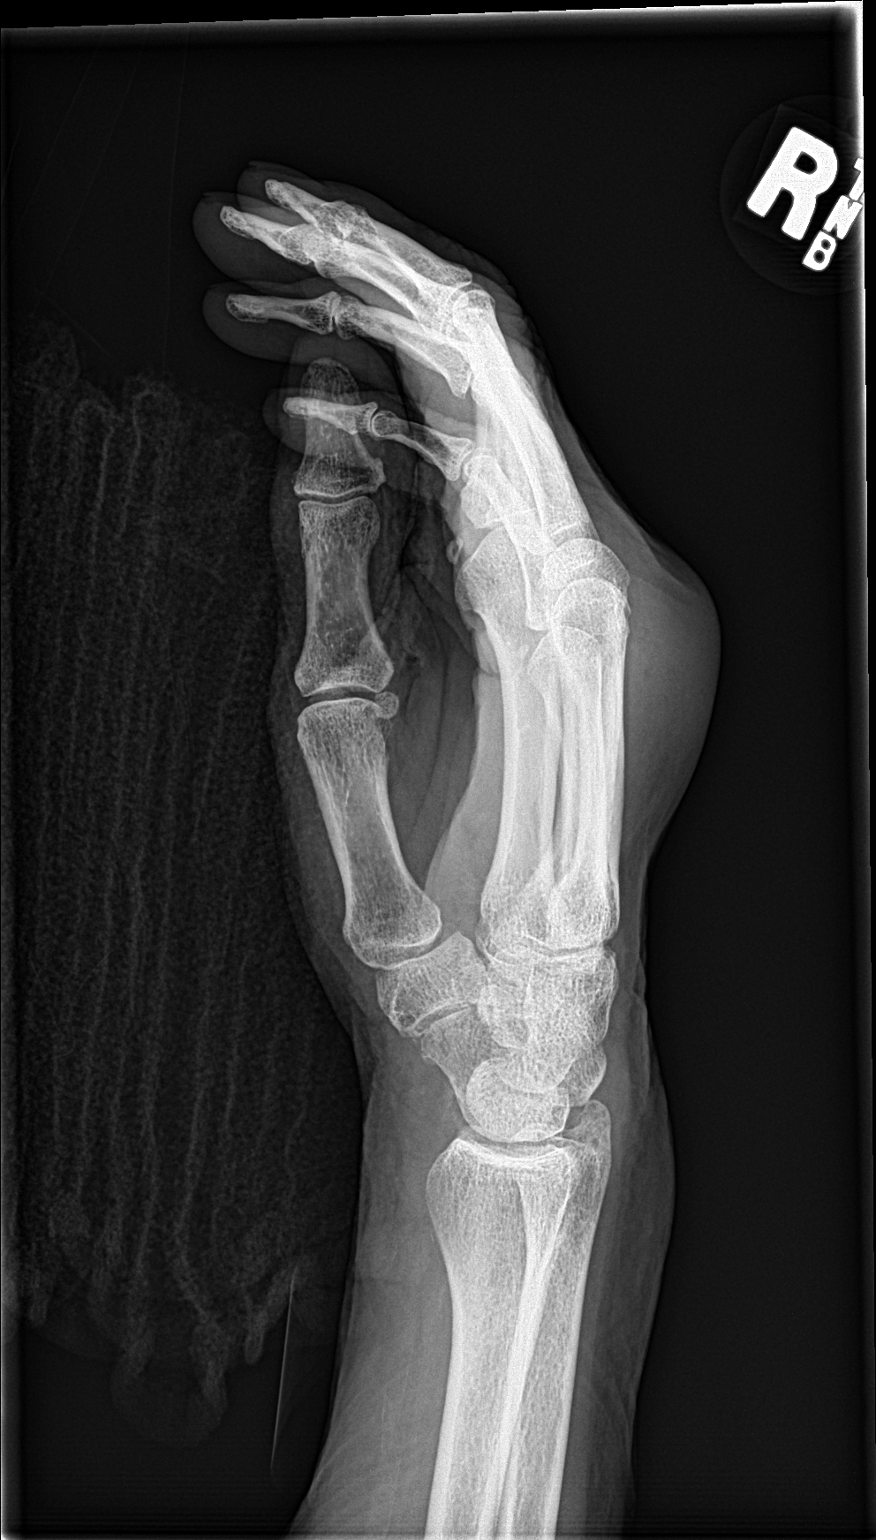

[3 of 3 positions shown; findings below may reference images not displayed]

FINDINGS: No definite acute displaced fracture or malalignment. Large amount
of soft tissue swelling over the dorsum of the hand. No radiopaque
foreign body. Calcification at the triangular fibrocartilage.
Additional soft tissue calcification adjacent to the scaphoid.
IMPRESSION: 1. Large amount of dorsal soft tissue swelling. No definite acute
displaced fracture is seen.
2. Calcification at the triangular fibrocartilage suggesting
chondrocalcinosis.

## 2017-11-15 IMAGING — DX DG HIP (WITH OR WITHOUT PELVIS) 2-3V*L*
3 series · 3 of 3 positions shown · non-contrast
Comparison: 05/11/2016

CLINICAL DATA: Fall with left hip pain

EXAM:
DG HIP (WITH OR WITHOUT PELVIS) 2-3V LEFT

[pelvis ap]
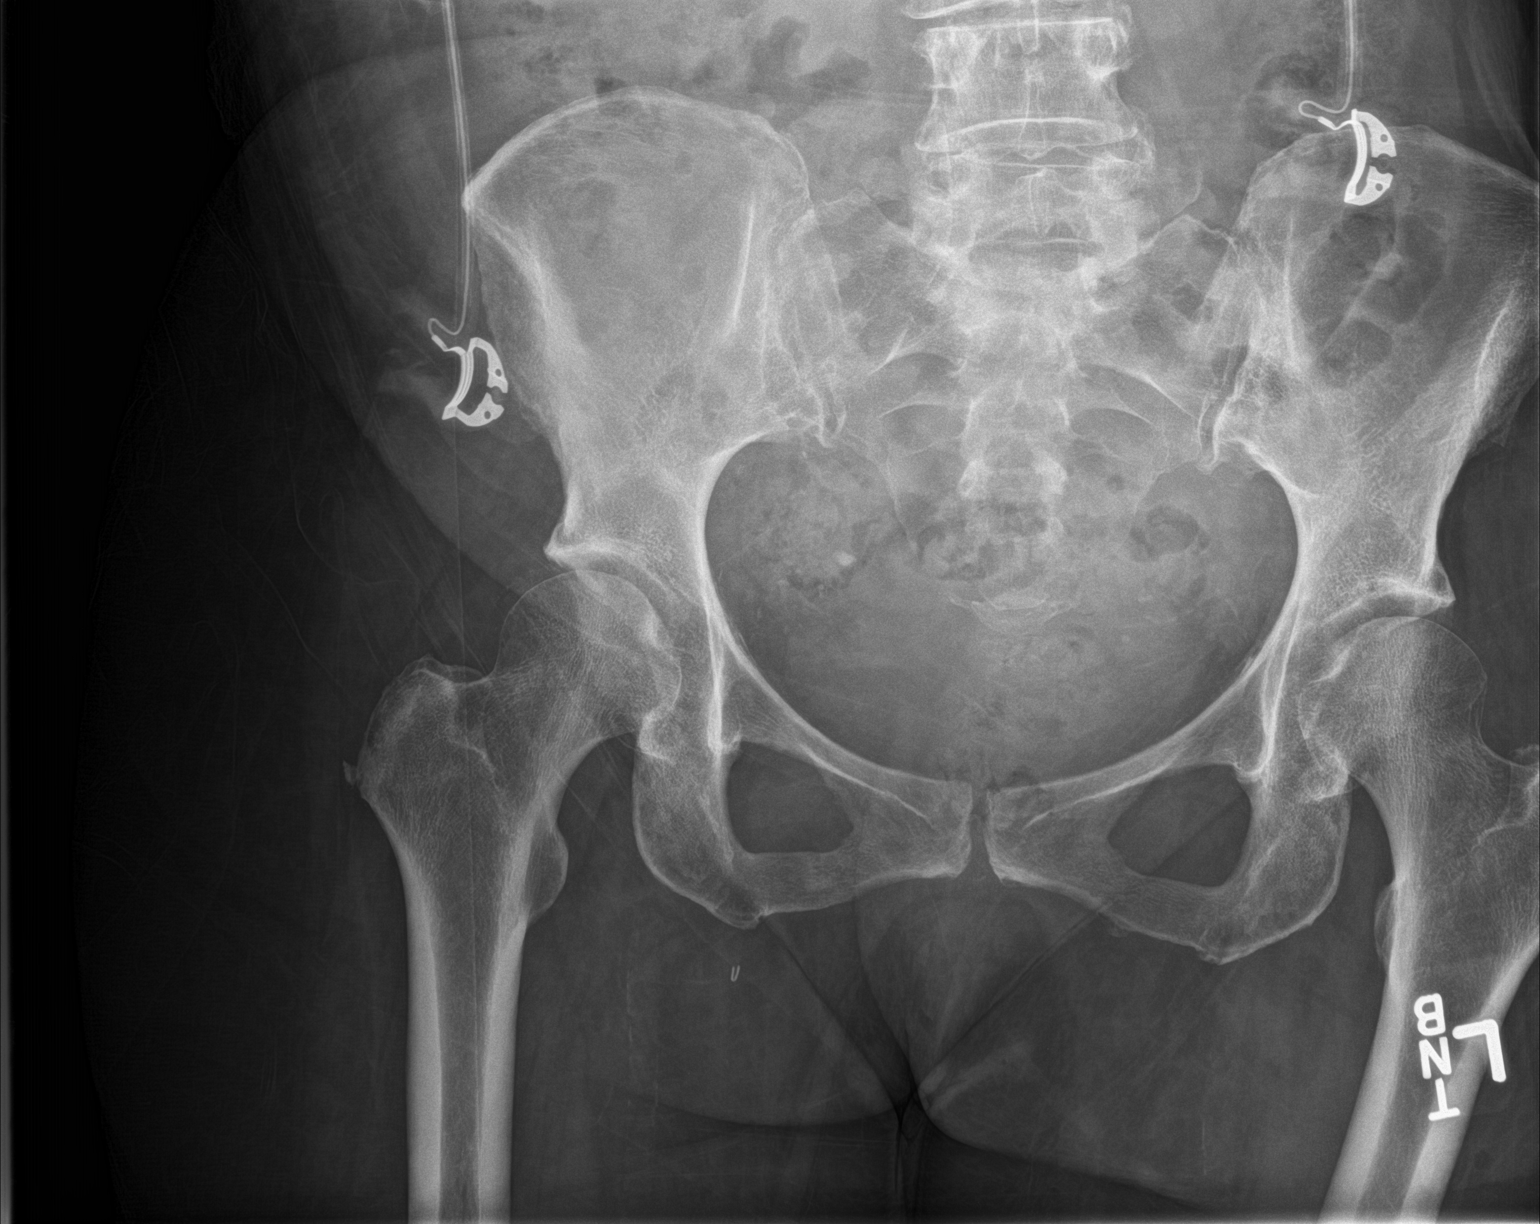

[hip ap]
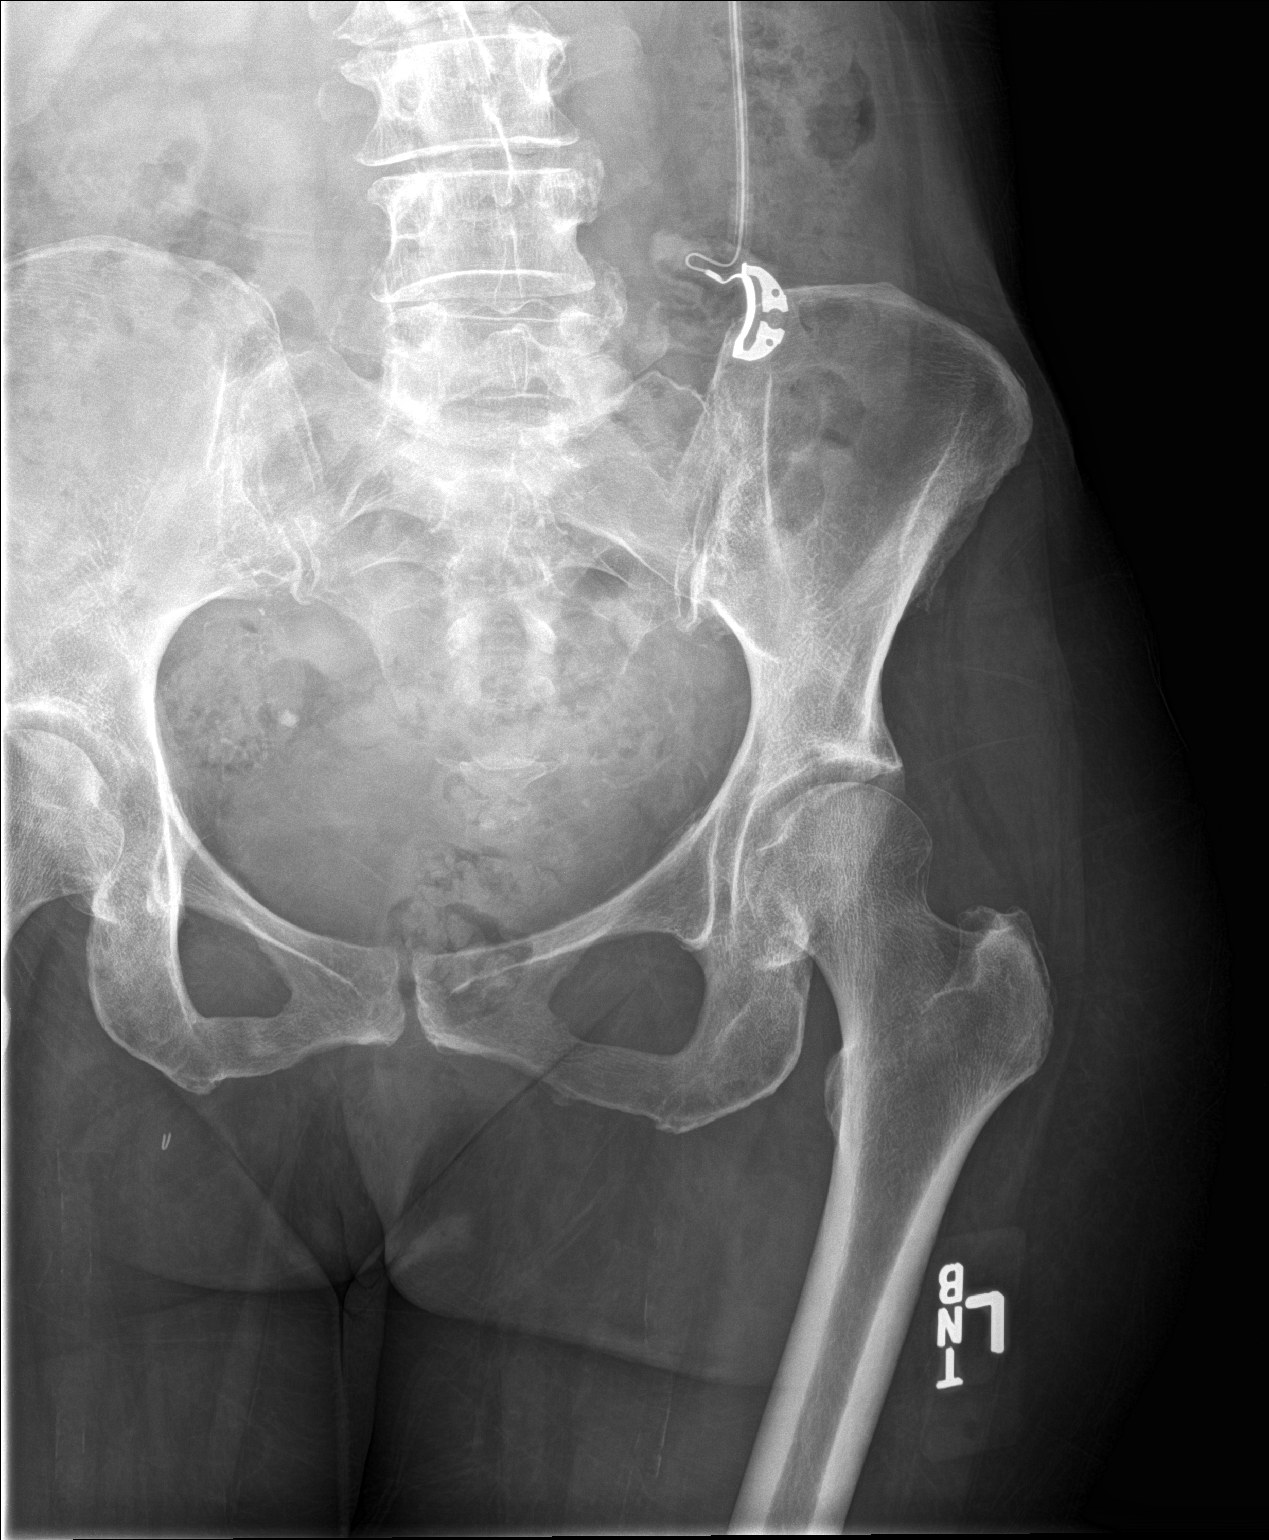

[hip lat]
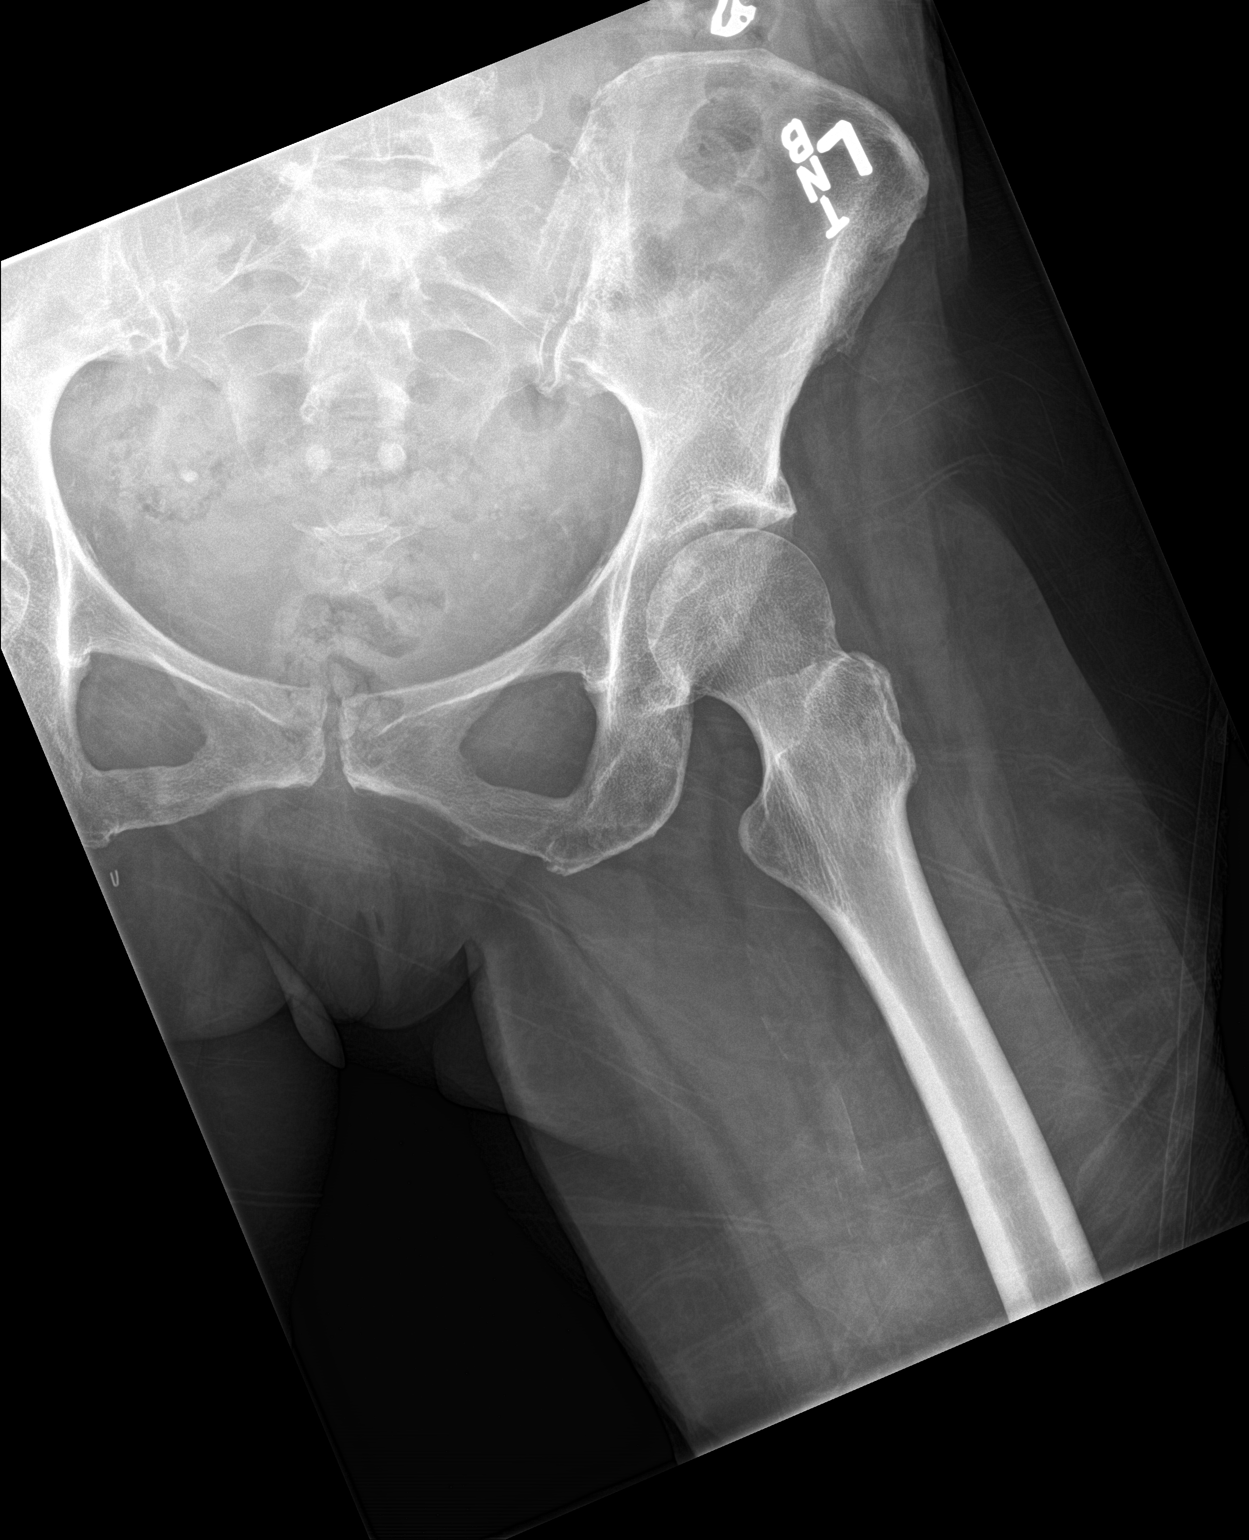

[3 of 3 positions shown; findings below may reference images not displayed]

FINDINGS: No acute displaced fracture or malalignment. The SI joints are
symmetric. Pubic symphysis appears intact. Small clip inferior to
the right ischium. Joint space is relatively maintained.
IMPRESSION: No definite acute osseous abnormality.
